# Patient Record
Sex: Male | Born: 1980 | Race: White | Hispanic: No | Marital: Married | Smoking: Never smoker
Health system: Southern US, Community
[De-identification: ages and names within clinical notes are randomized; demographics above are authoritative.]

## PROBLEM LIST (undated history)

## (undated) DIAGNOSIS — R809 Proteinuria, unspecified: Secondary | ICD-10-CM

## (undated) DIAGNOSIS — J45990 Exercise induced bronchospasm: Secondary | ICD-10-CM

## (undated) HISTORY — PX: REFRACTIVE SURGERY: SHX103

## (undated) HISTORY — DX: Proteinuria, unspecified: R80.9

## (undated) HISTORY — DX: Exercise induced bronchospasm: J45.990

## (undated) HISTORY — PX: WISDOM TOOTH EXTRACTION: SHX21

## (undated) HISTORY — PX: TONSILLECTOMY AND ADENOIDECTOMY: SUR1326

---

## 1999-08-14 ENCOUNTER — Encounter: Payer: Self-pay | Admitting: Internal Medicine

## 1999-08-14 ENCOUNTER — Ambulatory Visit (HOSPITAL_COMMUNITY): Admission: RE | Admit: 1999-08-14 | Discharge: 1999-08-14 | Payer: Self-pay | Admitting: Internal Medicine

## 2004-06-28 ENCOUNTER — Ambulatory Visit: Payer: Self-pay | Admitting: Internal Medicine

## 2004-10-31 ENCOUNTER — Ambulatory Visit: Payer: Self-pay | Admitting: Internal Medicine

## 2005-01-13 ENCOUNTER — Ambulatory Visit: Payer: Self-pay | Admitting: Internal Medicine

## 2005-01-22 ENCOUNTER — Ambulatory Visit: Payer: Self-pay | Admitting: Internal Medicine

## 2005-02-07 ENCOUNTER — Encounter: Admission: RE | Admit: 2005-02-07 | Discharge: 2005-02-07 | Payer: Self-pay | Admitting: Internal Medicine

## 2007-05-04 ENCOUNTER — Ambulatory Visit: Payer: Self-pay | Admitting: Internal Medicine

## 2007-05-06 LAB — CONVERTED CEMR LAB
Mumps IgG: 1.3 — ABNORMAL HIGH
Rubella: 48.5 intl units/mL — ABNORMAL HIGH
Rubeola IgG: 1.71 — ABNORMAL HIGH
Varicella IgG: 2.65 — ABNORMAL HIGH

## 2008-11-15 ENCOUNTER — Ambulatory Visit: Payer: Self-pay | Admitting: Internal Medicine

## 2008-11-15 DIAGNOSIS — J45909 Unspecified asthma, uncomplicated: Secondary | ICD-10-CM | POA: Insufficient documentation

## 2009-10-03 ENCOUNTER — Ambulatory Visit: Payer: Self-pay | Admitting: Internal Medicine

## 2009-10-03 DIAGNOSIS — J019 Acute sinusitis, unspecified: Secondary | ICD-10-CM

## 2009-10-03 DIAGNOSIS — J1189 Influenza due to unidentified influenza virus with other manifestations: Secondary | ICD-10-CM | POA: Insufficient documentation

## 2010-09-24 NOTE — Assessment & Plan Note (Signed)
Summary: ?SINUS INFECTION,FEVER 101.5/RH......Marland Kitchen   Vital Signs:  Patient profile:   30 year old male Weight:      153.8 pounds Temp:     100.1 degrees F oral Pulse rate:   124 / minute Resp:     15 per minute BP sitting:   120 / 80  (left arm) Cuff size:   regular  Vitals Entered By: Shonna Chock (October 03, 2009 1:35 PM) CC: Cough and a lot of congestion. Tested + for Flu on Sunday @ walk-in clinic Comments Taking Tamiflu for temp Symptoms   CC:  Cough and a lot of congestion. Tested + for Flu on Sunday @ walk-in clinic.  History of Present Illness: Onset 09/27/2009 as rigors , fever , myalgias, earache & frontal headache. Flu B + on on 02/06; Tamiflu Rxed. Now frontal headache, bilateral earache , dental pain, facial pain , yellow green from head & chest. PMH of EIB. Sinusitis in late 07/2009.  Allergies (verified): No Known Drug Allergies  Review of Systems General:  Complains of fever and sweats; denies chills. Eyes:  Denies discharge, eye pain, and red eye. ENT:  Complains of nasal congestion and sinus pressure; denies ear discharge. Resp:  Complains of sputum productive; OCCASIONAL WHEEZING; PMH of  EIB. GI:  Denies diarrhea. GU:  Denies discharge, dysuria, and hematuria.  Physical Exam  General:  Appears mildly uncomfortable-appearing.   Eyes:  No corneal or conjunctival inflammation noted. EOMI. Perrla. Ears:  Wax on R; L WNL Nose:  External nasal examination shows no deformity or inflammation. Nasal mucosa are  boggy without lesions or exudates. Mouth:  Oral mucosa and oropharynx without lesions or exudates.  Teeth in good repair. pharyngeal erythema.   Neck:  Supple Lungs:  Normal respiratory effort, chest expands symmetrically. Lungs are clear to auscultation, no crackles or wheezes. Deep cough Heart:   S4 tachycardia.   Extremities:  Neg SLR Cervical Nodes:  Shotty Axillary Nodes:  No palpable lymphadenopathy   Impression & Recommendations:  Problem #  1:  SINUSITIS- ACUTE-NOS (ICD-461.9)  His updated medication list for this problem includes:    Cefuroxime Axetil 500 Mg Tabs (Cefuroxime axetil) ..... 1 two times a day    Hydrocod Polst-chlorphen Polst 10-8 Mg/5ml Lqcr (Chlorpheniramine-hydrocodone) ..... 1 tsp q 8-12 hrs as needed  Problem # 2:  INFLUENZA (ICD-487.8)  Complete Medication List: 1)  Cefuroxime Axetil 500 Mg Tabs (Cefuroxime axetil) .... 1 two times a day 2)  Hydrocod Polst-chlorphen Polst 10-8 Mg/5ml Lqcr (Chlorpheniramine-hydrocodone) .... 1 tsp q 8-12 hrs as needed  Patient Instructions: 1)  Drink as much fluid as you can tolerate for the next few days. Neti pot once daily two times a day for head congestion. Advair every 12 hrs prn 2)  Recommended remaining out of work for  02/09 & 10/04/2009 Prescriptions: HYDROCOD POLST-CHLORPHEN POLST 10-8 MG/5ML LQCR (CHLORPHENIRAMINE-HYDROCODONE) 1 tsp q 8-12 hrs as needed  #90cc x 0   Entered and Authorized by:   Arielle Eber MD   Signed by:   Itzabella Sorrels MD on 10/03/2009   Method used:   Printed then faxed to ...       [id number] (retail)       [address]       [place], [place]  [address]       Ph: [phone] or [phone]       Fax: [phone]   RxID:   [id number] CEFUROXIME AXETIL 500 MG TABS (CEFUROXIME AXETIL) 1 two  times a day  #20 x 0   Entered and Authorized by:   Marga Melnick MD   Signed by:   Marga Melnick MD on 10/03/2009   Method used:   Faxed to ...       [id number] (retail)       [address]       [place], [place]  [address]       Ph: [phone] or [phone]       Fax: [phone]   RxID:   [id number]

## 2011-12-29 ENCOUNTER — Ambulatory Visit (HOSPITAL_BASED_OUTPATIENT_CLINIC_OR_DEPARTMENT_OTHER)
Admission: RE | Admit: 2011-12-29 | Discharge: 2011-12-29 | Disposition: A | Discharge status: Home / Self Care | Payer: BC Managed Care – PPO | Source: Ambulatory Visit | Attending: Internal Medicine | Admitting: Internal Medicine

## 2011-12-29 ENCOUNTER — Encounter: Payer: Self-pay | Admitting: Internal Medicine

## 2011-12-29 ENCOUNTER — Ambulatory Visit (INDEPENDENT_AMBULATORY_CARE_PROVIDER_SITE_OTHER): Payer: BC Managed Care – PPO | Admitting: Internal Medicine

## 2011-12-29 VITALS — BP 126/88 | HR 83 | Temp 98.3°F | Wt 157.0 lb

## 2011-12-29 DIAGNOSIS — M5412 Radiculopathy, cervical region: Secondary | ICD-10-CM

## 2011-12-29 DIAGNOSIS — M542 Cervicalgia: Secondary | ICD-10-CM

## 2011-12-29 DIAGNOSIS — M5414 Radiculopathy, thoracic region: Secondary | ICD-10-CM

## 2011-12-29 DIAGNOSIS — M549 Dorsalgia, unspecified: Secondary | ICD-10-CM

## 2011-12-29 DIAGNOSIS — M5030 Other cervical disc degeneration, unspecified cervical region: Secondary | ICD-10-CM | POA: Insufficient documentation

## 2011-12-29 DIAGNOSIS — M546 Pain in thoracic spine: Secondary | ICD-10-CM | POA: Insufficient documentation

## 2011-12-29 DIAGNOSIS — IMO0002 Reserved for concepts with insufficient information to code with codable children: Secondary | ICD-10-CM

## 2011-12-29 DIAGNOSIS — M4802 Spinal stenosis, cervical region: Secondary | ICD-10-CM

## 2011-12-29 MED ORDER — TRAMADOL HCL 50 MG PO TABS: 50.0000 mg | ORAL_TABLET | Freq: Four times a day (QID) | ORAL | Status: AC | PRN

## 2011-12-29 NOTE — Patient Instructions (Addendum)
Order for x-rays entered into  the computer; these will be performed at [facility]. No appointment is necessary.    Use a cervical memory foam pillow to prevent hyperextension or hyperflexion of the cervical spine. The best exercises for the low back include freestyle swimming, stretch aerobics, and yoga.  Please try to go on My Chart within the next 24 hours to allow me to release the results directly to you.

## 2011-12-29 NOTE — Progress Notes (Signed)
  Subjective:    Patient ID: Ethan Washington, male    DOB: 07/11/1981, 31 y.o.   MRN: [id number]  HPI In July 2011 he awoke with a "crick" in his neck. This was associated with discomfort and decreased range of motion. This spontaneously resolved over 2 weeks but  recurred shortly thereafter. Since that time has been constant; it is described as dull/vomiting. It will radiate into the left trapezius and triceps area. Nonsteroidals have been of minimal benefit. He comes in at this time as he states it is affecting his mood.  Past medical history/family history/social history were all reviewed and updated. Pertinent data: all non conntributory      Review of Systems History of repetitive motion:  no  History of overuse or hyperextension: no  History of trauma:  no   Symptoms Back Pain: only in trapezius Numbness/tingling: no Weakness:  no Fever:  no  Headache: L occipital area Bowel/bladder dysfunction: no       Objective:   Physical Exam Gen.: Thin but healthy and well-nourished in appearance. Alert, appropriate and cooperative throughout exam. Head: Normocephalic without obvious abnormalities  Eyes: No corneal or conjunctival inflammation noted. . Neck: No deformities, masses, or tenderness noted. Range of motion & Thyroid normal. Neck is supple Lungs: Normal respiratory effort; chest expands symmetrically. Lungs are clear to auscultation without rales, wheezes, or increased work of breathing. Heart: Normal rate and rhythm. Normal S1 and S2. No gallop, click, or rub. S4 w/o  murmur.                                                            Musculoskeletal/extremities: There is reverse curvature of the spine at the upper thoracic area with loss of expected lordotic curve and a lordotic curve in the lumbosacral area.. No clubbing, cyanosis, edema, or deformity noted. Range of motion  normal .Tone & strength  normal.Joints normal. Nail health  good. He is able to lie flat and sit up  without help. Straight leg raising is negative Vascular: Carotid artery pulses are full and equal. No bruits present. Neurologic: Alert and oriented x3. Deep tendon reflexes symmetrical and normal. Gait including tiptoe and heel walking is normal         Skin: Intact without suspicious lesions or rashes. Lymph: No cervical, axillary lymphadenopathy present. Psych: Mood and affect are normal. Normally interactive                                                                                         Assessment & Plan:  #1 neck discomfort with some radicular symptoms in the left trapezius and left triceps area. C7-T2 involvement suggested. This is in the context of the spinal curvature changes noted above.  Plan: See orders and recommendations. Baseline films of the cervical spine and thoracic spine indicated. If there is no congenital issue; physical therapy or chiropractor would be recommended.

## 2012-10-09 ENCOUNTER — Other Ambulatory Visit: Payer: Self-pay

## 2013-01-20 ENCOUNTER — Ambulatory Visit (INDEPENDENT_AMBULATORY_CARE_PROVIDER_SITE_OTHER): Payer: BC Managed Care – PPO | Admitting: Internal Medicine

## 2013-01-20 ENCOUNTER — Encounter: Payer: Self-pay | Admitting: Internal Medicine

## 2013-01-20 VITALS — BP 116/70 | HR 85 | Wt 161.0 lb

## 2013-01-20 DIAGNOSIS — R809 Proteinuria, unspecified: Secondary | ICD-10-CM

## 2013-01-20 LAB — BASIC METABOLIC PANEL
CO2: 29 mEq/L (ref 19–32)
Calcium: 9.2 mg/dL (ref 8.4–10.5)
Creatinine, Ser: 1 mg/dL (ref 0.4–1.5)
GFR: 92.67 mL/min (ref >60.00)
Sodium: 137 mEq/L (ref 135–145)

## 2013-01-20 LAB — POCT URINALYSIS DIPSTICK
Ketones, UA: NEGATIVE
Leukocytes, UA: NEGATIVE
Nitrite, UA: NEGATIVE
Protein, UA: NEGATIVE
Urobilinogen, UA: 0.2
pH, UA: 7.5

## 2013-01-20 NOTE — Progress Notes (Signed)
  Subjective:    Patient ID: Ethan Washington, male    DOB: 02-09-1981, 32 y.o.   MRN: [id number]  HPI  As part of an insurance exam; a urinalysis was performed which revealed some protein in the urine. He is to be related based on presumed "kidney disease". There is no past medical history or family history of renal disease.  He is very physically active performing calisthenics and running 3 miles every other day. The urine collected was in the context of this high level of exercise and some subjective dehydration.   Review of Systems   He denies dysuria, pyuria, hematuria, polyuria, polydipsia, or polyphagia.     Objective:   Physical Exam  Appears healthy and well-nourished & in no acute distress  No carotid bruits are present.No neck pain distention present at 10 - 15 degrees. Thyroid normal to palpation  Heart rhythm and rate are normal with no significant murmurs or gallops.Accentuated S1  Chest is clear with no increased work of breathing  There is no evidence of aortic aneurysm or renal artery bruits  Abdomen soft with no organomegaly or masses. No HJR  No clubbing, cyanosis or edema present.  Pedal pulses are intact   No ischemic skin changes are present . Nails healthy    Alert and oriented. Strength, tone, DTRs reflexes normal          Assessment & Plan:  #1 proteinuria on dip urine in the context of high level of exercise and possible component of dehydration. No past medical history or family history of renal disease.  Plan: Renal function; 24 urine for protein

## 2013-01-20 NOTE — Patient Instructions (Addendum)
Please collect urine for 24 hours for protein. Avoid heavy physical exercise for 48 hours prior to collection and stay well hydrated.

## 2013-01-27 LAB — PROTEIN, URINE, 24 HOUR: Protein, 24H Urine: 651 mg/d — ABNORMAL HIGH (ref 50–100)

## 2013-01-28 ENCOUNTER — Telehealth: Payer: Self-pay | Admitting: Internal Medicine

## 2013-01-28 DIAGNOSIS — R809 Proteinuria, unspecified: Secondary | ICD-10-CM

## 2013-01-28 NOTE — Telephone Encounter (Signed)
Left message to call office to advise Pt Ethan Washington just reviewed result on yesterday evening after hours and his assistance has not  Had a chance to place orders for referral yet. However I have place referral and our referral coordinator will be back in contact with him about appt info.

## 2013-01-28 NOTE — Telephone Encounter (Signed)
I made patient aware a Nephrology referral has been entered, and that he will be contacted by [facility] to schedule, he understands.

## 2013-01-28 NOTE — Telephone Encounter (Signed)
Patient states that he was supposed to receive a referral to see a Nephrologist. Referral not in system. Please call to advise.

## 2013-06-28 ENCOUNTER — Ambulatory Visit (INDEPENDENT_AMBULATORY_CARE_PROVIDER_SITE_OTHER): Payer: BC Managed Care – PPO | Admitting: Internal Medicine

## 2013-06-28 ENCOUNTER — Encounter: Payer: Self-pay | Admitting: Internal Medicine

## 2013-06-28 VITALS — BP 127/79 | HR 84 | Temp 98.5°F

## 2013-06-28 DIAGNOSIS — J209 Acute bronchitis, unspecified: Secondary | ICD-10-CM

## 2013-06-28 DIAGNOSIS — J0100 Acute maxillary sinusitis, unspecified: Secondary | ICD-10-CM

## 2013-06-28 MED ORDER — CEFUROXIME AXETIL 500 MG PO TABS: 500.0000 mg | ORAL_TABLET | Freq: Two times a day (BID) | ORAL | Status: DC

## 2013-06-28 MED ORDER — BUDESONIDE-FORMOTEROL FUMARATE 160-4.5 MCG/ACT IN AERO: 2.0000 | INHALATION_SPRAY | Freq: Two times a day (BID) | RESPIRATORY_TRACT | Status: DC

## 2013-06-28 MED ORDER — HYDROCODONE-HOMATROPINE 5-1.5 MG/5ML PO SYRP: 5.0000 mL | ORAL_SOLUTION | Freq: Four times a day (QID) | ORAL | Status: DC | PRN

## 2013-06-28 NOTE — Progress Notes (Signed)
  Subjective:    Patient ID: Ethan Washington, male    DOB: 1980-10-24, 32 y.o.   MRN: [id number]  HPI  Symptoms began in September of this year as a dry cough which is  stable and persistent. He does produce scant green sputum intermittently and has had some green nasal discharge. He describes facial sinus area pain. He has shortness of breath with the cough.  As of this morning at 3 AM he said pressure in the right ear associated with decreased hearing and tinnitus. There has been no discharge the ear. He's also had pain in his upper teeth.  He's been using albuterol with less effect   Review of Systems  He denies extrinsic symptoms of itchy, watery eyes or sneezing.  He has no frontal sinus pain.  There's been no wheezing with cough.  He denies fever, chills, or sweats.      Objective:   Physical Exam General appearance:good health ;well nourished; no acute distress or increased work of breathing is present.  No  lymphadenopathy about the head, neck, or axilla noted.   Eyes: No conjunctival inflammation or lid edema is present.   Ears:  External ear exam shows no significant lesions or deformities.  Hemorrhagic changes left TM. Increase from the right canal with minimal visualization of the right TM  Nose:  External nasal examination shows no deformity or inflammation. Nasal mucosa are dry and erythematous without lesions or exudates. No septal dislocation or deviation.No obstruction to airflow.   Oral exam: Dental hygiene is good; lips and gums are healthy appearing.There is no oropharyngeal erythema or exudate noted.   Neck:  No deformities,  masses, or tenderness noted.      Heart:  Normal rate and regular rhythm. S1 and S2 normal without gallop, murmur, click, rub or other extra sounds.   Lungs:Chest clear to auscultation; no wheezes, rhonchi,rales ,or rubs present.No increased work of breathing.   Harsh brassy cough  Extremities:  No cyanosis, edema, or clubbing  noted     Skin: Warm & dry          Assessment & Plan:  #1 protracted bronchitis  #2 maxillary sinusitis  #3 ear pain decreased hearing &  tinnitus ; most likely related to the cerumen  Plan :see orders

## 2013-06-28 NOTE — Patient Instructions (Signed)
Please do not use Q-tips as we discussed. Should wax build up occur, please put 2-3 drops of mineral oil in the affected  ear at night to soften the wax .Cover the canal with a  cotton ball to prevent the oil from staining bed linens. In the morning fill the ear canal with hydrogen peroxide & lie in the opposite lateral decubitus position(on the side opposite the affected ear)  for 10-15 minutes. After allowing this period of time for the peroxide to dissolve the wax ;shower and use the thinnest washrag available to wick out the wax. If both ears are involved ; alternate this treatment from ear to ear each night until no wax is found on the washrag.Plain Mucinex (NOT D) for thick secretions ;force NON dairy fluids .   Nasal cleansing in the shower as discussed with lather of mild shampoo.After 10 seconds wash off lather while  exhaling through nostrils. Make sure that all residual soap is removed to prevent irritation.  Nasacort AQ OTC 1 spray in each nostril twice a day as needed. Use the "crossover" technique into opposite nostril spraying toward opposite ear @ 45 degree angle, not straight up into nostril.  Use a Neti pot daily only  as needed for significant sinus congestion; going from open side to congested side . Plain Allegra (NOT D )  160 daily , Loratidine 10 mg , OR Zyrtec 10 mg @ bedtime  as needed for itchy eyes & sneezing.

## 2013-06-30 ENCOUNTER — Other Ambulatory Visit: Payer: Self-pay

## 2013-09-08 ENCOUNTER — Telehealth: Payer: Self-pay | Admitting: *Deleted

## 2013-09-08 ENCOUNTER — Other Ambulatory Visit: Payer: Self-pay | Admitting: *Deleted

## 2013-09-08 MED ORDER — OSELTAMIVIR PHOSPHATE 75 MG PO CAPS: 75.0000 mg | ORAL_CAPSULE | Freq: Every day | ORAL | Status: DC

## 2013-09-08 NOTE — Telephone Encounter (Signed)
Script sent. JG//CMA 

## 2013-09-08 NOTE — Telephone Encounter (Signed)
Patient called and stated his wife was tested positive for flu yesterday. He would like to know if he should begin Tamiflu. Please advise. JG//CMA  *send script to [facility]

## 2013-09-08 NOTE — Telephone Encounter (Signed)
Tamiflu 75 mg qd #10.

## 2014-06-19 ENCOUNTER — Other Ambulatory Visit: Payer: Self-pay | Admitting: Nephrology

## 2014-06-19 ENCOUNTER — Ambulatory Visit
Admission: RE | Admit: 2014-06-19 | Discharge: 2014-06-19 | Disposition: A | Discharge status: Home / Self Care | Payer: PRIVATE HEALTH INSURANCE | Source: Ambulatory Visit | Attending: Nephrology | Admitting: Nephrology

## 2014-06-19 DIAGNOSIS — R059 Cough, unspecified: Secondary | ICD-10-CM

## 2014-06-19 DIAGNOSIS — R05 Cough: Secondary | ICD-10-CM

## 2014-07-09 ENCOUNTER — Encounter: Payer: Self-pay | Admitting: Internal Medicine

## 2014-07-09 DIAGNOSIS — R809 Proteinuria, unspecified: Secondary | ICD-10-CM | POA: Insufficient documentation

## 2015-04-09 ENCOUNTER — Ambulatory Visit (HOSPITAL_BASED_OUTPATIENT_CLINIC_OR_DEPARTMENT_OTHER)
Admission: RE | Admit: 2015-04-09 | Discharge: 2015-04-09 | Disposition: A | Discharge status: Home / Self Care | Payer: PRIVATE HEALTH INSURANCE | Source: Ambulatory Visit | Attending: Medical | Admitting: Medical

## 2015-04-09 ENCOUNTER — Ambulatory Visit (INDEPENDENT_AMBULATORY_CARE_PROVIDER_SITE_OTHER): Payer: PRIVATE HEALTH INSURANCE | Admitting: Medical

## 2015-04-09 ENCOUNTER — Encounter: Payer: Self-pay | Admitting: Medical

## 2015-04-09 ENCOUNTER — Encounter: Payer: PRIVATE HEALTH INSURANCE | Admitting: Medical

## 2015-04-09 VITALS — BP 130/80 | HR 77 | Temp 98.7°F | Ht 69.75 in | Wt 162.6 lb

## 2015-04-09 DIAGNOSIS — R55 Syncope and collapse: Secondary | ICD-10-CM | POA: Diagnosis not present

## 2015-04-09 DIAGNOSIS — R42 Dizziness and giddiness: Secondary | ICD-10-CM

## 2015-04-09 DIAGNOSIS — R0602 Shortness of breath: Secondary | ICD-10-CM | POA: Insufficient documentation

## 2015-04-09 DIAGNOSIS — R0600 Dyspnea, unspecified: Secondary | ICD-10-CM

## 2015-04-09 DIAGNOSIS — R5383 Other fatigue: Secondary | ICD-10-CM | POA: Diagnosis not present

## 2015-04-09 DIAGNOSIS — I517 Cardiomegaly: Secondary | ICD-10-CM

## 2015-04-09 LAB — POCT URINALYSIS DIPSTICK
BILIRUBIN UA: NEGATIVE
Glucose, UA: NEGATIVE
KETONES UA: NEGATIVE
Leukocytes, UA: NEGATIVE
Nitrite, UA: NEGATIVE
PH UA: 7
Protein, UA: NEGATIVE
RBC UA: NEGATIVE
Spec Grav, UA: 1.015
Urobilinogen, UA: 2

## 2015-04-09 LAB — COMPREHENSIVE METABOLIC PANEL
ALBUMIN: 4.3 g/dL (ref 3.5–5.2)
ALK PHOS: 69 U/L (ref 39–117)
ALT: 23 U/L (ref 0–53)
AST: 23 U/L (ref 0–37)
BILIRUBIN TOTAL: 0.6 mg/dL (ref 0.2–1.2)
BUN: 15 mg/dL (ref 6–23)
CO2: 33 mEq/L — ABNORMAL HIGH (ref 19–32)
CREATININE: 0.98 mg/dL (ref 0.40–1.50)
Calcium: 9.4 mg/dL (ref 8.4–10.5)
Chloride: 100 mEq/L (ref 96–112)
GFR: 92.52 mL/min (ref >60.00)
Glucose, Bld: 94 mg/dL (ref 70–99)
Potassium: 4 mEq/L (ref 3.5–5.1)
SODIUM: 138 meq/L (ref 135–145)
TOTAL PROTEIN: 6.8 g/dL (ref 6.0–8.3)

## 2015-04-09 LAB — CBC WITH DIFFERENTIAL/PLATELET
Basophils Absolute: 0 10*3/uL (ref 0.0–0.1)
Basophils Relative: 0.4 % (ref 0.0–3.0)
EOS ABS: 0 10*3/uL (ref 0.0–0.7)
EOS PCT: 0.9 % (ref 0.0–5.0)
HCT: 46.8 % (ref 39.0–52.0)
HEMOGLOBIN: 15.8 g/dL (ref 13.0–17.0)
Lymphocytes Relative: 26.1 % (ref 12.0–46.0)
Lymphs Abs: 1.3 10*3/uL (ref 0.7–4.0)
MCHC: 33.8 g/dL (ref 30.0–36.0)
MCV: 90.8 fl (ref 78.0–100.0)
MONO ABS: 0.4 10*3/uL (ref 0.1–1.0)
Monocytes Relative: 8.3 % (ref 3.0–12.0)
Neutro Abs: 3.3 10*3/uL (ref 1.4–7.7)
Neutrophils Relative %: 64.3 % (ref 43.0–77.0)
Platelets: 238 10*3/uL (ref 150.0–400.0)
RBC: 5.16 Mil/uL (ref 4.22–5.81)
RDW: 12.7 % (ref 11.5–15.5)
WBC: 5.1 10*3/uL (ref 4.0–10.5)

## 2015-04-09 LAB — TSH: TSH: 1.64 u[IU]/mL (ref 0.35–4.50)

## 2015-04-09 MED ORDER — ALBUTEROL SULFATE HFA 108 (90 BASE) MCG/ACT IN AERS: 2.0000 | INHALATION_SPRAY | Freq: Four times a day (QID) | RESPIRATORY_TRACT | Status: AC | PRN

## 2015-04-09 NOTE — Progress Notes (Signed)
Pre visit review using our clinic review tool, if applicable. No additional management support is needed unless otherwise documented below in the visit note. 

## 2015-04-09 NOTE — Progress Notes (Signed)
Subjective:    Patient ID: Ethan Washington, male    DOB: Nov 08, 1980, 34 y.o.   MRN: [id number]  HPI   I have reviewed pt PMH, PSH, FH, Social History and Surgical History.  Pt in for recent near syncope. Pt states a week ago he dizzy and light headed. He felt like he would almost pass out. But did not. 2nd time he was eating. Pt states rare occasional light headed sensation in past before these 2 most recent events. States occasional if long time without eating will feel shaky. But these 2 described possible near syncope may were not after not eating for long period of time. (the second time came on with acute sweating and slight shaky when felt very dizzy)  Pt states he has been feeling tired since this last Thursday. Pt states feels very short of breath going up flights of steps. Also cutting grass felt very tired.   Pt states some bright red in his stools. No itching to his rectum. Hx of some hemorrhoids. No rectal pain. No dark black stools.  Pt has hx of occasional inhaler use following upper respiratory infection. 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        Review of Systems  Constitutional: Positive for fatigue. Negative for fever and chills.  Respiratory: Positive for shortness of breath. Negative for cough, choking and chest tightness.   Cardiovascular: Negative for chest pain and palpitations.  Neurological: Positive for dizziness. Negative for seizures, syncope, weakness, light-headedness and numbness.       No syncope but describes almost near syncope.  Hematological: Negative for adenopathy. Does not bruise/bleed easily.  Psychiatric/Behavioral: Negative for behavioral problems and confusion.    Past Medical History  Diagnosis Date  . Exercise induced bronchospasm     PMH of    Social History   Social History  . Marital Status: Married    Spouse Name: N/A  . Number of Children: N/A  . Years of Education: N/A     Occupational History  . Not on file.   Social History Main Topics  . Smoking status: Never Smoker   . Smokeless tobacco: Not on file  . Alcohol Use: Yes     Comment:  2 wine / week or <  . Drug Use: No  . Sexual Activity: Not on file   Other Topics Concern  . Not on file   Social History Narrative    Past Surgical History  Procedure Laterality Date  . Tonsillectomy and adenoidectomy    . Wisdom tooth extraction    . Refractive surgery Bilateral     Family History  Problem Relation Age of Onset  . Stroke Mother 2  . Heart disease Mother     myocarditis  . Cancer Maternal Grandfather     cns  . Diabetes Maternal Grandfather   . Lung cancer Paternal Grandmother     smoker  . Diabetes Paternal Grandfather   . Hypertension Neg Hx   . Kidney disease Neg Hx     No Known Allergies  Current Outpatient Prescriptions on File Prior to Visit  Medication Sig Dispense Refill  . budesonide-formoterol (SYMBICORT) 160-4.5 MCG/ACT inhaler Inhale 2 puffs into the lungs 2 (two) times daily. (Patient not taking: Reported on 04/09/2015) 1 Inhaler 3  . cefUROXime (CEFTIN) 500 MG tablet Take 1 tablet (500 mg total) by mouth 2 (two) times daily. (Patient not taking: Reported on 04/09/2015) 20  tablet 0  . HYDROcodone-homatropine (HYDROMET) 5-1.5 MG/5ML syrup Take 5 mLs by mouth every 6 (six) hours as needed for cough. (Patient not taking: Reported on 04/09/2015) 120 mL 0  . oseltamivir (TAMIFLU) 75 MG capsule Take 1 capsule (75 mg total) by mouth daily. (Patient not taking: Reported on 04/09/2015) 10 capsule 0   No current facility-administered medications on file prior to visit.    BP 130/80 mmHg  Pulse 77  Temp(Src) 98.7 F (37.1 C) (Oral)  Ht 5' 9.75" (1.772 m)  Wt 162 lb 9.6 oz (73.755 kg)  BMI 23.49 kg/m2  SpO2 100%       Objective:   Physical Exam  General Mental Status- Alert. General Appearance- Not in acute distress.   Skin General: Color- Normal Color. Moisture-  Normal Moisture.  Neck Carotid Arteries- Normal color. Moisture- Normal Moisture. No carotid bruits. No JVD.  Chest and Lung Exam Auscultation: Breath Sounds:-Normal.  Cardiovascular Auscultation:Rythm- Regular, rate and rhythm. Murmurs & Other Heart Sounds:Auscultation of the heart reveals- No Murmurs.  Abdomen Inspection:-Inspeection Normal. Palpation/Percussion:Note:No mass. Palpation and Percussion of the abdomen reveal- Non Tender, Non Distended + BS, no rebound or guarding.    Neurologic Cranial Nerve exam:- CN III-XII intact(No nystagmus), symmetric smile. Drift Test:- No drift. Romberg Exam:- Negative.  Heal to Toe Gait exam:-Normal. Finger to Nose:- Normal/Intact Strength:- 5/5 equal and symmetric strength both upper and lower extremities.  Lower ext- no pedal edema. Neg homans signs.  Rectum- on inspection. No hemorrhoids seen. No fissures.      Assessment & Plan:  No ekg to compare. Appears to show lvh and rt bundle branch block.(in light of profound described near syncope will refer to cardiologist)  For you dizziness, fatigue and sob.  Will get cbc, cmp, tsh, cxr and did ekg.  Will try to find old ekg to compare. But with symptoms may refer to cardiology. Presently no exercise. Take elevator at work.   Follow up in 10 days or as needed  Follow up with nephrologist for history of proteinuria.  If dizzy episodes occur at work. Ask staff to check bp, pulse 02 and fsbs.

## 2015-04-09 NOTE — Patient Instructions (Addendum)
For you dizziness, fatigue and sob.  Will get cbc, cmp, tsh, cxr and did ekg.  Will try to find old ekg to compare. But with symptoms may refer to cardiology. Presently no exercise. Take elevator at work.   Follow up in 10 days or as needed  Follow up with nephrologist for history of proteinuria.  If dizzy episodes occur at work. Ask staff to check bp, pulse 02 and fsbs.

## 2015-04-12 ENCOUNTER — Ambulatory Visit (INDEPENDENT_AMBULATORY_CARE_PROVIDER_SITE_OTHER): Payer: PRIVATE HEALTH INSURANCE | Admitting: Cardiology

## 2015-04-12 ENCOUNTER — Telehealth: Payer: Self-pay | Admitting: Cardiology

## 2015-04-12 ENCOUNTER — Encounter: Payer: Self-pay | Admitting: Cardiology

## 2015-04-12 ENCOUNTER — Encounter: Payer: Self-pay | Admitting: *Deleted

## 2015-04-12 VITALS — BP 132/82 | HR 94 | Ht 70.0 in | Wt 163.5 lb

## 2015-04-12 DIAGNOSIS — R0600 Dyspnea, unspecified: Secondary | ICD-10-CM

## 2015-04-12 DIAGNOSIS — R55 Syncope and collapse: Secondary | ICD-10-CM | POA: Diagnosis not present

## 2015-04-12 NOTE — Assessment & Plan Note (Signed)
Etiology unclear. Some episodes sound orthostatic mediated but others less so. These episodes are long-standing as he had his first one in his late teenage years. I will schedule an echocardiogram to assess LV function. We will arrange an event monitor to exclude significant arrhythmia. I have asked him to increase his fluid and salt intake in case orthostasis is contributing.

## 2015-04-12 NOTE — Progress Notes (Signed)
     HPI: 34 year old male for evaluation of near syncope. Patient seen recently by primary care for near syncope. Chest x-ray negative. Hemoglobin, TSH and potassium normal. Patient has had intermittent dizzy spells for approximately 15 years. Some occur with sudden standing but others have occurred while seated. He had 2 episodes recently and we were asked to evaluate. His most recent episode occurred at work. He was sitting down to eat and developed sudden dizziness. No associated dyspnea, nausea, chest pain or palpitations. His symptoms lasted approximately 15-20 seconds and resolved. He has never had frank syncope. Otherwise denies dyspnea on exertion, orthopnea, PND, pedal edema or exertional chest pain. Some dizziness with standing at times.  Current Outpatient Prescriptions  Medication Sig Dispense Refill  . albuterol (PROVENTIL HFA;VENTOLIN HFA) 108 (90 BASE) MCG/ACT inhaler Inhale 2 puffs into the lungs every 6 (six) hours as needed for wheezing or shortness of breath. 1 Inhaler 0  . Multiple Vitamins-Minerals (MULTIVITAMIN WITH MINERALS) tablet Take 1 tablet by mouth daily.     No current facility-administered medications for this visit.    No Known Allergies   Past Medical History  Diagnosis Date  . Exercise induced bronchospasm     PMH of  . Proteinuria     Past Surgical History  Procedure Laterality Date  . Tonsillectomy and adenoidectomy    . Wisdom tooth extraction    . Refractive surgery Bilateral     Social History   Social History  . Marital Status: Married    Spouse Name: N/A  . Number of Children: 2  . Years of Education: N/A   Occupational History  .      [employer]   Social History Main Topics  . Smoking status: Never Smoker   . Smokeless tobacco: Not on file  . Alcohol Use: Yes     Comment:  2 wine / week or <  . Drug Use: No  . Sexual Activity: Not on file   Other Topics Concern  . Not on file   Social History Narrative     Family History  Problem Relation Age of Onset  . Stroke Mother 55  . Heart disease Mother     myocarditis  . Cancer Maternal Grandfather     cns  . Diabetes Maternal Grandfather   . Lung cancer Paternal Grandmother     smoker  . Diabetes Paternal Grandfather   . Hypertension Neg Hx   . Kidney disease Neg Hx     ROS: no fevers or chills, productive cough, hemoptysis, dysphasia, odynophagia, melena, hematochezia, dysuria, hematuria, rash, seizure activity, orthopnea, PND, pedal edema, claudication. Remaining systems are negative.  Physical Exam:   Blood pressure 132/82, pulse 94, height  (1.778 m), weight 163 lb 8 oz (74.163 kg).  General:  Well developed/well nourished in NAD Skin warm/dry Patient not depressed No peripheral clubbing Back-normal HEENT-normal/normal eyelids Neck supple/normal carotid upstroke bilaterally; no bruits; no JVD; no thyromegaly chest - CTA/ normal expansion CV - RRR/normal S1 and S2; no murmurs, rubs or gallops;  PMI nondisplaced Abdomen -NT/ND, no HSM, no mass, + bowel sounds, no bruit 2+ femoral pulses, no bruits Ext-no edema, chords, 2+ DP Neuro-grossly nonfocal  ECG 04/09/2015-sinus rhythm, incomplete right bundle branch block.

## 2015-04-12 NOTE — Patient Instructions (Signed)
Your physician recommends that you schedule a follow-up appointment in: 8 WEEKS WITH DR Jens Som  Your physician has requested that you have an echocardiogram. Echocardiography is a painless test that uses sound waves to create images of your heart. It provides your doctor with information about the size and shape of your heart and how well your heart's chambers and valves are working. This procedure takes approximately one hour. There are no restrictions for this procedure.   Your physician has recommended that you wear an event monitor. Event monitors are medical devices that record the heart's electrical activity. Doctors most often [place] these monitors to diagnose arrhythmias. Arrhythmias are problems with the speed or rhythm of the heartbeat. The monitor is a small, portable device. You can wear one while you do your normal daily activities. This is usually used to diagnose what is causing palpitations/syncope (passing out).

## 2015-04-12 NOTE — Telephone Encounter (Signed)
Received records from [facility] for appointment on 04/12/15 with Dr Jens Som.  Records given to [facility] (medical records) for Dr Ludwig Clarks schedule on 04/12/15. lp

## 2015-04-20 ENCOUNTER — Other Ambulatory Visit (INDEPENDENT_AMBULATORY_CARE_PROVIDER_SITE_OTHER): Payer: PRIVATE HEALTH INSURANCE

## 2015-04-20 DIAGNOSIS — R0789 Other chest pain: Secondary | ICD-10-CM

## 2015-04-20 DIAGNOSIS — E785 Hyperlipidemia, unspecified: Secondary | ICD-10-CM

## 2015-04-20 LAB — LIPID PANEL
CHOLESTEROL: 235 mg/dL — AB (ref 0–200)
HDL: 60.5 mg/dL (ref >39.00)
LDL Cholesterol: 160 mg/dL — ABNORMAL HIGH (ref 0–99)
NonHDL: 174.88
Total CHOL/HDL Ratio: 4
Triglycerides: 76 mg/dL (ref 0.0–149.0)
VLDL: 15.2 mg/dL (ref 0.0–40.0)

## 2015-04-20 NOTE — Telephone Encounter (Signed)
Pt came in fasting. He was under impression get labs. I reviewed notes and did not see that I advised him to repeat or get new. However he has some sob episodes, dizziness and has seen cardiology. Full work up pending. I talked with him and he states occasionally with pain will get low level mild chest discomfort. So advised lab to get lipid panel. Put in diagnosis atypical chest pain.

## 2015-04-23 NOTE — Addendum Note (Signed)
Addended by: Neldon Labella on: 04/23/2015 08:59 AM   Modules accepted: Orders

## 2015-04-24 ENCOUNTER — Telehealth: Payer: Self-pay | Admitting: Cardiology

## 2015-04-24 NOTE — Telephone Encounter (Signed)
Order for echo and monitor mailed to patient

## 2015-04-24 NOTE — Telephone Encounter (Signed)
Pt called in and cancelled his Event monitor and his Echo because he was informed by his insurance that since he works for [employer] he could have these test done for free. He would like to know if he needs a referral in order to have the echo and monitor done. Please f/u with pt  Thanks

## 2015-04-27 ENCOUNTER — Other Ambulatory Visit (HOSPITAL_COMMUNITY): Payer: PRIVATE HEALTH INSURANCE

## 2015-06-08 ENCOUNTER — Ambulatory Visit: Payer: PRIVATE HEALTH INSURANCE | Admitting: Cardiology

## 2015-07-27 ENCOUNTER — Other Ambulatory Visit: Payer: PRIVATE HEALTH INSURANCE

## 2015-07-27 ENCOUNTER — Other Ambulatory Visit (INDEPENDENT_AMBULATORY_CARE_PROVIDER_SITE_OTHER): Payer: PRIVATE HEALTH INSURANCE

## 2015-07-27 ENCOUNTER — Telehealth: Payer: Self-pay | Admitting: Medical

## 2015-07-27 DIAGNOSIS — E785 Hyperlipidemia, unspecified: Secondary | ICD-10-CM

## 2015-07-27 LAB — LIPID PANEL
CHOL/HDL RATIO: 4
Cholesterol: 243 mg/dL — ABNORMAL HIGH (ref 0–200)
HDL: 56.1 mg/dL (ref >39.00)
LDL Cholesterol: 168 mg/dL — ABNORMAL HIGH (ref 0–99)
NONHDL: 187.21
Triglycerides: 94 mg/dL (ref 0.0–149.0)
VLDL: 18.8 mg/dL (ref 0.0–40.0)

## 2015-07-27 MED ORDER — ATORVASTATIN CALCIUM 10 MG PO TABS: 10.0000 mg | ORAL_TABLET | Freq: Every day | ORAL | Status: DC

## 2015-07-27 NOTE — Telephone Encounter (Signed)
crestor sent to his pharmacy. If he starts recheck cmp and lipid in 3 months fasting

## 2015-10-30 ENCOUNTER — Telehealth: Payer: Self-pay | Admitting: Medical

## 2015-10-30 DIAGNOSIS — Z3009 Encounter for other general counseling and advice on contraception: Secondary | ICD-10-CM

## 2015-10-30 NOTE — Telephone Encounter (Signed)
Referred pt to urologist.

## 2015-10-30 NOTE — Telephone Encounter (Signed)
Please advise on referral

## 2015-10-30 NOTE — Telephone Encounter (Signed)
Pt called in to request a referral to a [facility] if possible, he says that he is wanting a vasectomy . He says that he works for [employer]. ALSO, pt need appt to be on a Friday if possible.   CB: [phone]

## 2016-01-28 ENCOUNTER — Encounter: Payer: Self-pay | Admitting: Medical

## 2016-01-29 MED ORDER — ATORVASTATIN CALCIUM 10 MG PO TABS: 10.0000 mg | ORAL_TABLET | Freq: Every day | ORAL | Status: DC

## 2016-01-29 NOTE — Telephone Encounter (Signed)
Rx atorvastatin sent in.

## 2016-01-30 ENCOUNTER — Telehealth: Payer: Self-pay | Admitting: Medical

## 2016-01-30 NOTE — Telephone Encounter (Signed)
Relation to EA:VWUJpt:self Call back [phone] Pharmacy:  Reason for call:  Patient [facility] [place]

## 2016-01-31 ENCOUNTER — Telehealth: Payer: Self-pay

## 2016-01-31 NOTE — Telephone Encounter (Signed)
Spoke with Victorino DikeJennifer at [facility]. I had to verify the patients name, Date of birth, and his address. I was transferred to the other line with patient services from [place]. I was put on hold for 10 minutes and had to end the call due to pt. Care in the office. Per E.Saguier pt may have to change the medication that will be given to the patient. I advised Amado CoeJessica Glover that completes Prior Authorizations in the office.

## 2016-04-26 IMAGING — DX DG CHEST 2V
2 series · 2 of 2 positions shown · non-contrast
Comparison: 06/19/2014

CLINICAL DATA: Shortness of breath and fatigue

EXAM:
CHEST - 2 VIEW

[chest pa]
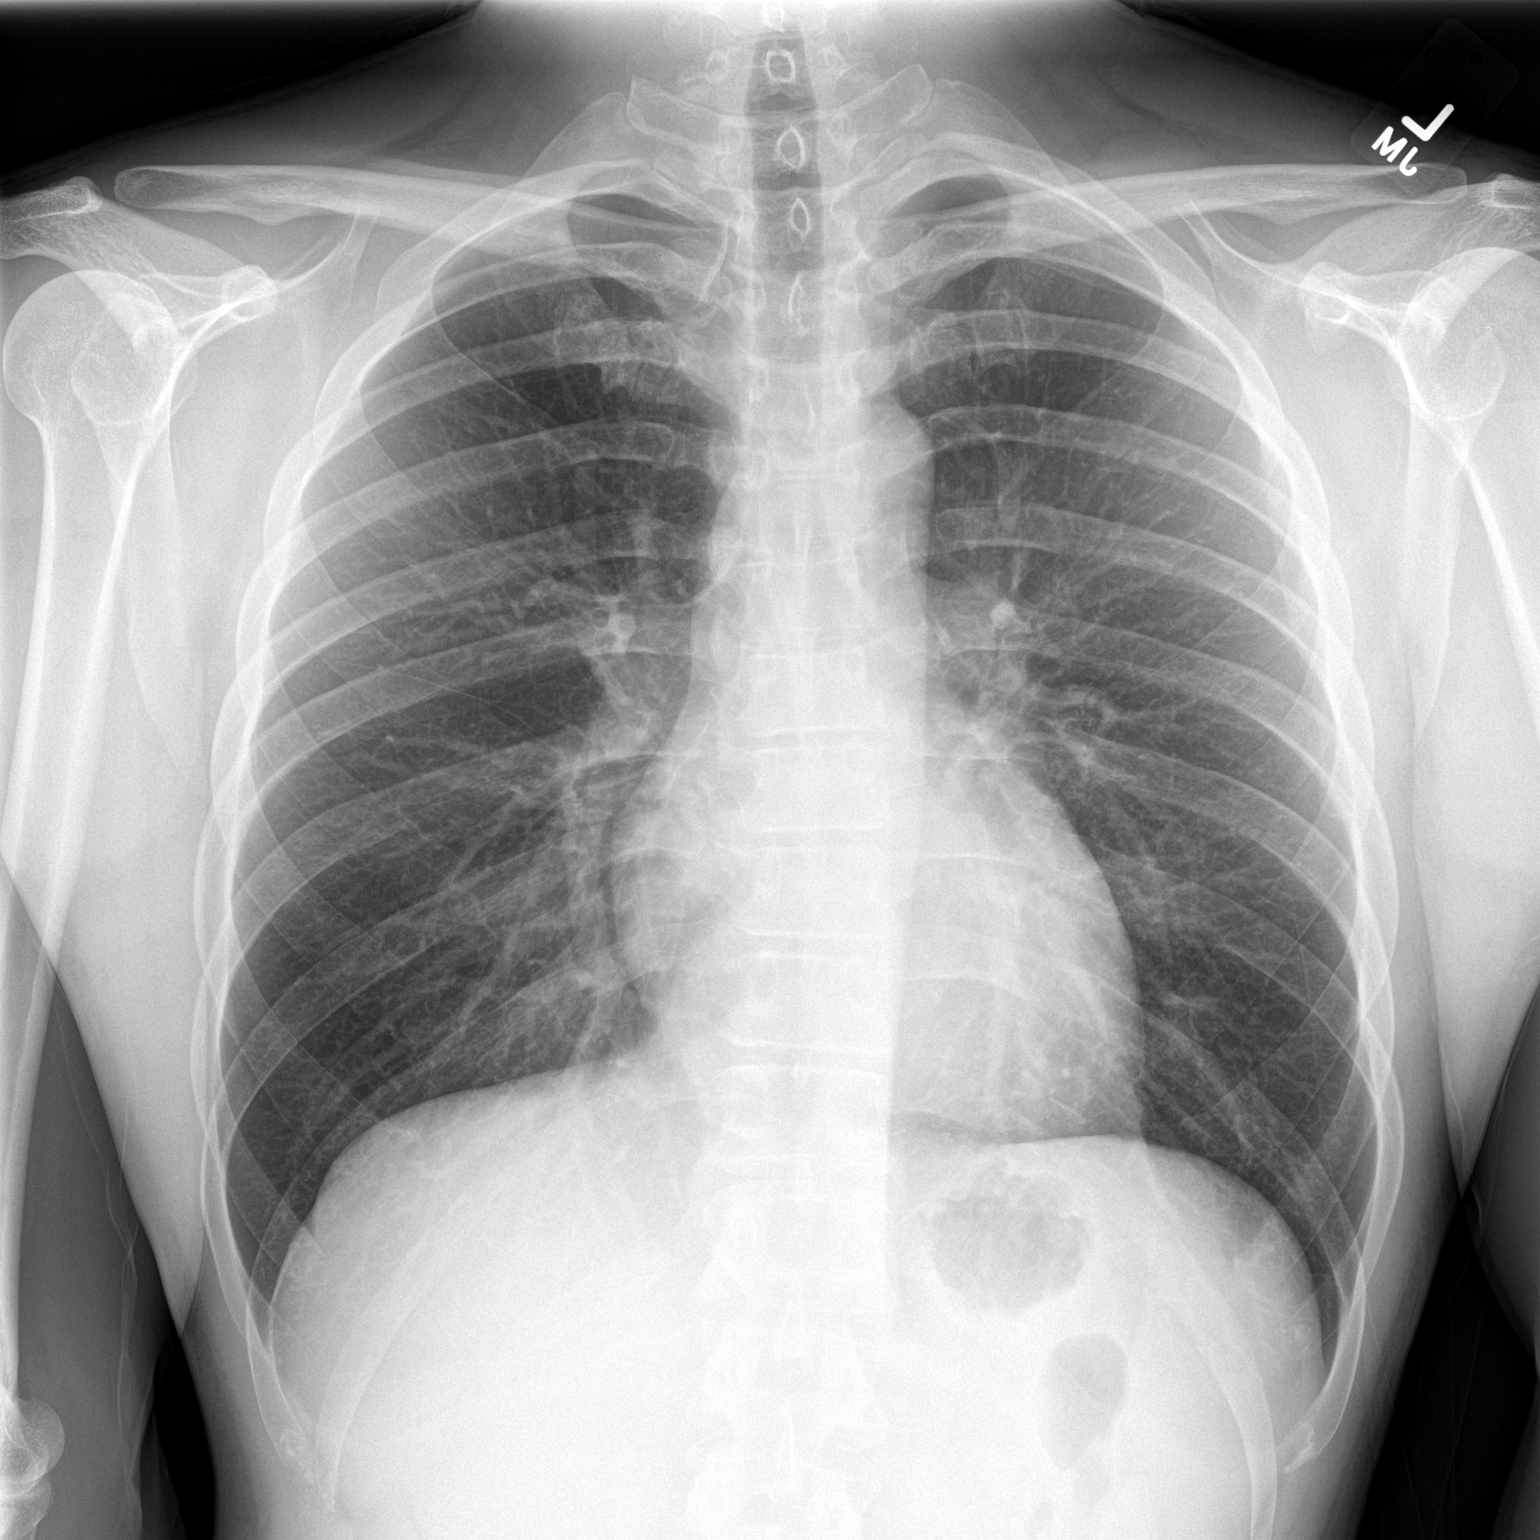

[chest lat]
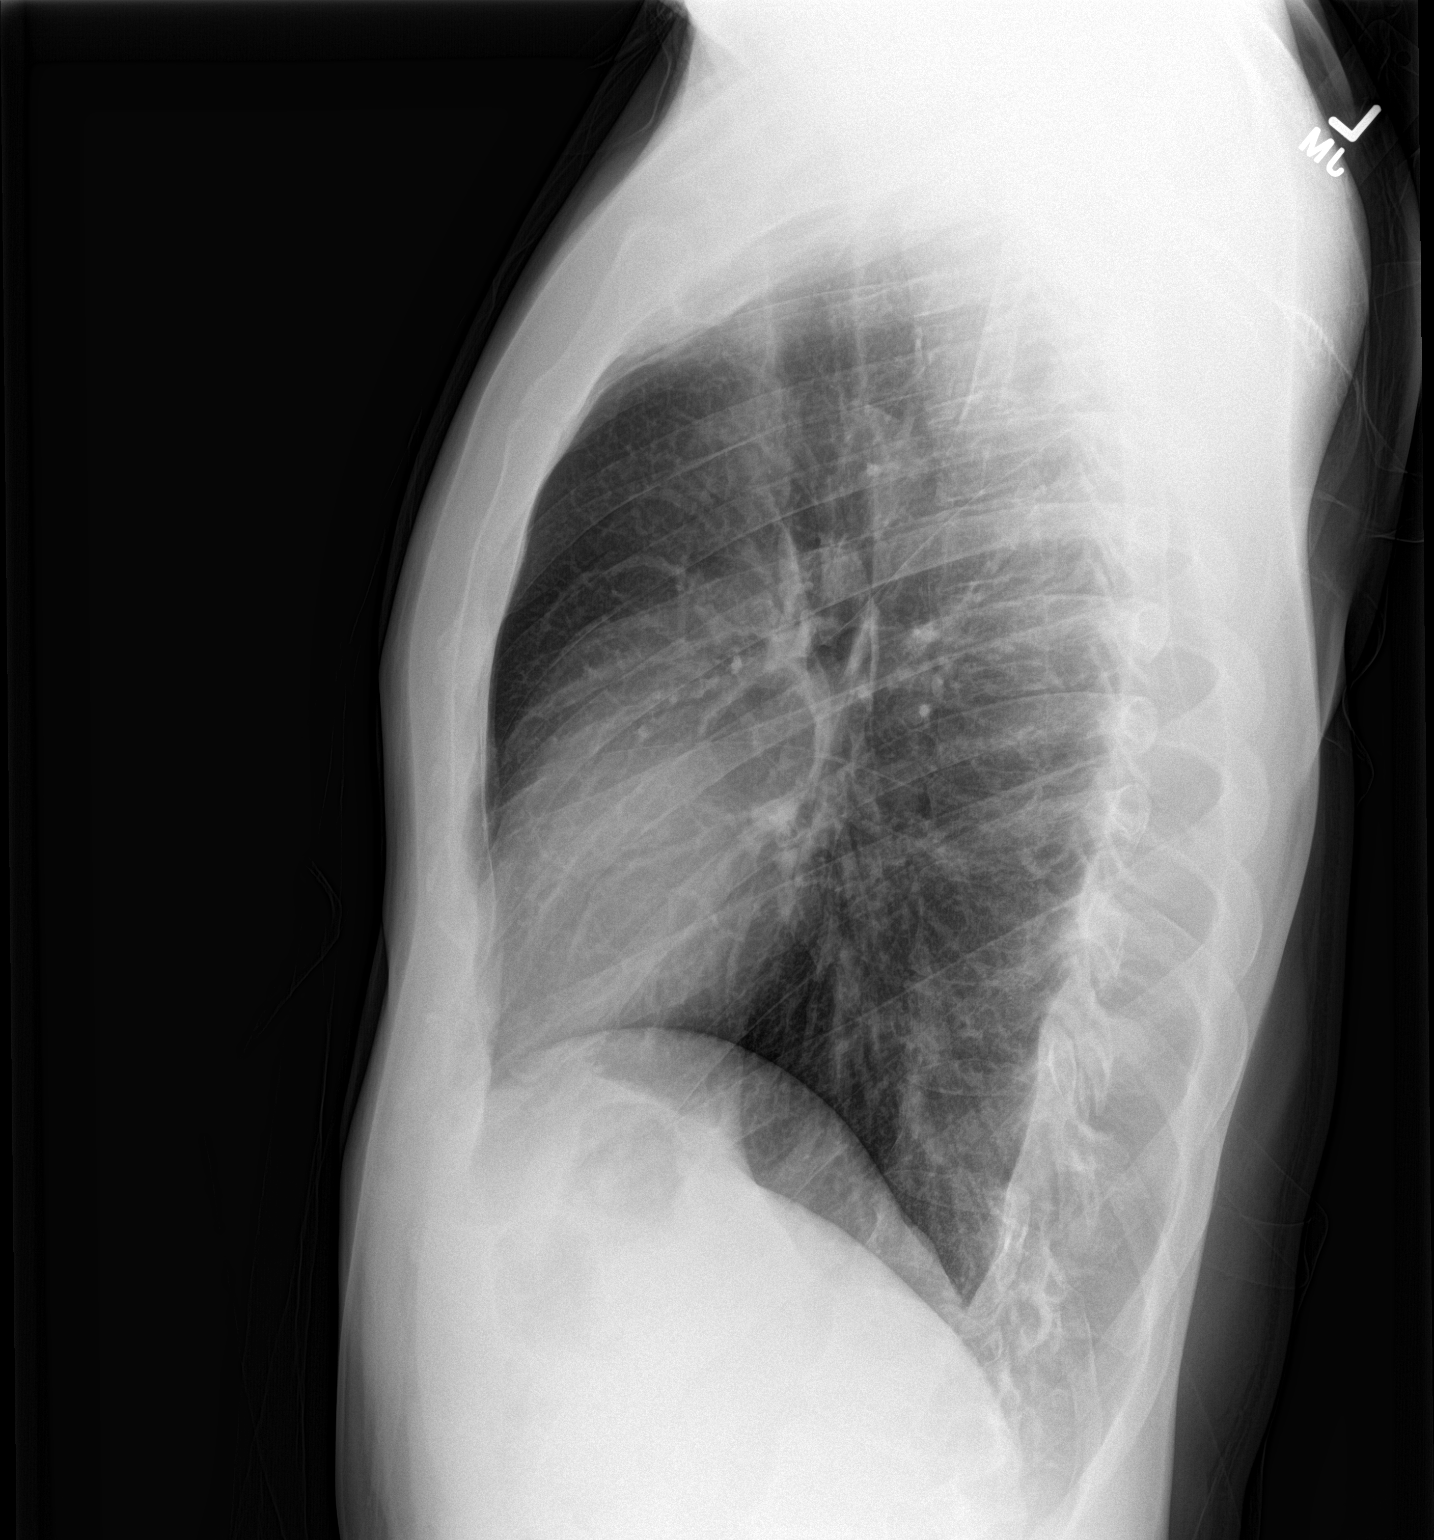

[2 of 2 positions shown; findings below may reference images not displayed]

FINDINGS: The heart size and mediastinal contours are within normal limits.
Both lungs are clear. The visualized skeletal structures are
unremarkable.
IMPRESSION: No active disease.

## 2019-09-09 ENCOUNTER — Other Ambulatory Visit: Payer: Self-pay

## 2019-09-09 ENCOUNTER — Ambulatory Visit (INDEPENDENT_AMBULATORY_CARE_PROVIDER_SITE_OTHER): Payer: BC Managed Care – PPO | Admitting: Podiatry

## 2019-09-09 DIAGNOSIS — B070 Plantar wart: Secondary | ICD-10-CM | POA: Diagnosis not present

## 2019-09-09 MED ORDER — FLUOROURACIL 5 % EX CREA: TOPICAL_CREAM | Freq: Two times a day (BID) | CUTANEOUS | 2 refills | Status: AC

## 2019-09-09 NOTE — Progress Notes (Signed)
Subjective:   Patient ID: Ethan Washington, male   DOB: 39 y.o.   MRN: [id number]   HPI Patient presents stating he has a lesion on the bottom of his left foot that has been present for about 7 months and is tried over-the-counter medication which only took the top layer off.  States he gets sore when he walks on it and has had history of warts on his hands   Review of Systems  All other systems reviewed and are negative.       Objective:  Physical Exam Vitals and nursing note reviewed.  Constitutional:      Appearance: He is well-developed.  Pulmonary:     Effort: Pulmonary effort is normal.  Musculoskeletal:        General: Normal range of motion.  Skin:    General: Skin is warm.  Neurological:     Mental Status: He is alert.     Neurovascular status found to be intact muscle strength was found to be adequate range of motion within normal limits.  Patient is found to have lesions plantar aspect left fourth metatarsal distal that upon debridement show pinpoint bleeding pain to lateral pressure.  Patient has good digital perfusion well oriented x3     Assessment:  Verruca plantaris plantar aspect left     Plan:  H&P reviewed condition and debrided lesion and applied chemical agent to create immune response with sterile dressing.  Prescribed Efudex for the postoperative period to try to reduce any viral tissue and reappoint 1 month and discussed possible laser if it remains present

## 2019-10-07 ENCOUNTER — Encounter: Payer: Self-pay | Admitting: Podiatry

## 2019-10-07 ENCOUNTER — Ambulatory Visit (INDEPENDENT_AMBULATORY_CARE_PROVIDER_SITE_OTHER): Payer: BC Managed Care – PPO | Admitting: Podiatry

## 2019-10-07 ENCOUNTER — Other Ambulatory Visit: Payer: Self-pay

## 2019-10-07 VITALS — Temp 97.0°F

## 2019-10-07 DIAGNOSIS — B070 Plantar wart: Secondary | ICD-10-CM

## 2019-10-07 NOTE — Progress Notes (Signed)
Subjective:   Patient ID: Ethan Washington, male   DOB: 39 y.o.   MRN: [id number]   HPI Patient states it seems to be improved and I did get a small blister after the initial procedure   ROS      Objective:  Physical Exam  Neurovascular status intact with small blistering of the left plantar foot that upon debridement I did note that there is still some tissue with tiny black dots and mild discomfort     Assessment:  Verruca plantaris left improved but present     Plan:  Debrided remaining tissue and applied chemical agent to create immune response with sterile dressing and explained what to do if any blistering were to occur.  Continue Efudex for several more weeks and be seen back as needed

## 2020-12-08 DIAGNOSIS — Z20822 Contact with and (suspected) exposure to covid-19: Secondary | ICD-10-CM | POA: Diagnosis not present

## 2021-11-07 DIAGNOSIS — H40013 Open angle with borderline findings, low risk, bilateral: Secondary | ICD-10-CM | POA: Diagnosis not present

## 2021-11-07 DIAGNOSIS — H5213 Myopia, bilateral: Secondary | ICD-10-CM | POA: Diagnosis not present

## 2022-06-13 ENCOUNTER — Telehealth: Payer: Self-pay | Admitting: Medical

## 2022-06-13 NOTE — Telephone Encounter (Signed)
Patient called to schedule his physical. Last OV in 2016. Patient would like to reestablish care. Please advise.

## 2022-06-13 NOTE — Telephone Encounter (Signed)
Ok for pt to re-establish. °

## 2022-07-11 ENCOUNTER — Encounter: Payer: Self-pay | Admitting: Medical

## 2022-07-11 ENCOUNTER — Other Ambulatory Visit: Payer: Self-pay | Admitting: Medical

## 2022-07-11 ENCOUNTER — Ambulatory Visit (INDEPENDENT_AMBULATORY_CARE_PROVIDER_SITE_OTHER): Payer: BC Managed Care – PPO | Admitting: Medical

## 2022-07-11 VITALS — BP 137/86 | HR 74 | Temp 97.7°F | Resp 18 | Ht 70.0 in | Wt 171.4 lb

## 2022-07-11 DIAGNOSIS — E785 Hyperlipidemia, unspecified: Secondary | ICD-10-CM

## 2022-07-11 DIAGNOSIS — K219 Gastro-esophageal reflux disease without esophagitis: Secondary | ICD-10-CM

## 2022-07-11 DIAGNOSIS — Z0000 Encounter for general adult medical examination without abnormal findings: Secondary | ICD-10-CM

## 2022-07-11 LAB — COMPREHENSIVE METABOLIC PANEL
ALT: 32 U/L (ref 0–53)
AST: 19 U/L (ref 0–37)
Albumin: 4.3 g/dL (ref 3.5–5.2)
Alkaline Phosphatase: 69 U/L (ref 39–117)
BUN: 13 mg/dL (ref 6–23)
CO2: 33 mEq/L — ABNORMAL HIGH (ref 19–32)
Calcium: 9.2 mg/dL (ref 8.4–10.5)
Chloride: 100 mEq/L (ref 96–112)
Creatinine, Ser: 1.2 mg/dL (ref 0.40–1.50)
GFR: 74.74 mL/min (ref >60.00)
Glucose, Bld: 91 mg/dL (ref 70–99)
Potassium: 4.1 mEq/L (ref 3.5–5.1)
Sodium: 137 mEq/L (ref 135–145)
Total Bilirubin: 0.6 mg/dL (ref 0.2–1.2)
Total Protein: 6.3 g/dL (ref 6.0–8.3)

## 2022-07-11 LAB — CBC WITH DIFFERENTIAL/PLATELET
Basophils Absolute: 0 10*3/uL (ref 0.0–0.1)
Basophils Relative: 0.4 % (ref 0.0–3.0)
Eosinophils Absolute: 0.1 10*3/uL (ref 0.0–0.7)
Eosinophils Relative: 1.2 % (ref 0.0–5.0)
HCT: 47.2 % (ref 39.0–52.0)
Hemoglobin: 15.9 g/dL (ref 13.0–17.0)
Lymphocytes Relative: 25.1 % (ref 12.0–46.0)
Lymphs Abs: 1.4 10*3/uL (ref 0.7–4.0)
MCHC: 33.6 g/dL (ref 30.0–36.0)
MCV: 90.9 fl (ref 78.0–100.0)
Monocytes Absolute: 0.4 10*3/uL (ref 0.1–1.0)
Monocytes Relative: 7.8 % (ref 3.0–12.0)
Neutro Abs: 3.7 10*3/uL (ref 1.4–7.7)
Neutrophils Relative %: 65.5 % (ref 43.0–77.0)
Platelets: 238 10*3/uL (ref 150.0–400.0)
RBC: 5.2 Mil/uL (ref 4.22–5.81)
RDW: 12.6 % (ref 11.5–15.5)
WBC: 5.6 10*3/uL (ref 4.0–10.5)

## 2022-07-11 LAB — LIPID PANEL
Cholesterol: 274 mg/dL — ABNORMAL HIGH (ref 0–200)
HDL: 56.1 mg/dL (ref >39.00)
LDL Cholesterol: 198 mg/dL — ABNORMAL HIGH (ref 0–99)
NonHDL: 218.28
Total CHOL/HDL Ratio: 5
Triglycerides: 99 mg/dL (ref 0.0–149.0)
VLDL: 19.8 mg/dL (ref 0.0–40.0)

## 2022-07-11 MED ORDER — FAMOTIDINE 20 MG PO TABS: 20.0000 mg | ORAL_TABLET | Freq: Two times a day (BID) | ORAL | 0 refills | Status: DC

## 2022-07-11 MED ORDER — ATORVASTATIN CALCIUM 10 MG PO TABS: 10.0000 mg | ORAL_TABLET | Freq: Every day | ORAL | 3 refills | Status: DC

## 2022-07-11 NOTE — Patient Instructions (Addendum)
For you wellness exam today I have ordered cbc, cmp and  lipid panel.  Vaccine up to date.  Recommend exercise and healthy diet.  We will let you know lab results as they come in.  Follow up date appointment will be determined after lab review.    For probable gerd- recommend eating healthy and start famotadine 20 mg twice daily.  For elevated blood pressure check bp daily over next week. Want to make sure not going over 140/90.  We discussed your family history. Will follow cholesterol. Will likely rx statin.  If any chest pain significant then be seen in ED. On follow up in month may get EKG.  Preventive Care 42-19 Years Old, Male Preventive care refers to lifestyle choices and visits with your health care provider that can promote health and wellness. Preventive care visits are also called wellness exams. What can I expect for my preventive care visit? Counseling During your preventive care visit, your health care provider may ask about your: Medical history, including: Past medical problems. Family medical history. Current health, including: Emotional well-being. Home life and relationship well-being. Sexual activity. Lifestyle, including: Alcohol, nicotine or tobacco, and drug use. Access to firearms. Diet, exercise, and sleep habits. Safety issues such as seatbelt and bike helmet use. Sunscreen use. Work and work Astronomer. Physical exam Your health care provider will check your: Height and weight. These may be used to calculate your BMI (body mass index). BMI is a measurement that tells if you are at a healthy weight. Waist circumference. This measures the distance around your waistline. This measurement also tells if you are at a healthy weight and may help predict your risk of certain diseases, such as type 2 diabetes and high blood pressure. Heart rate and blood pressure. Body temperature. Skin for abnormal spots. What immunizations do I need?  Vaccines are  usually given at various ages, according to a schedule. Your health care provider will recommend vaccines for you based on your age, medical history, and lifestyle or other factors, such as travel or where you work. What tests do I need? Screening Your health care provider may recommend screening tests for certain conditions. This may include: Lipid and cholesterol levels. Diabetes screening. This is done by checking your blood sugar (glucose) after you have not eaten for a while (fasting). Hepatitis B test. Hepatitis C test. HIV (human immunodeficiency virus) test. STI (sexually transmitted infection) testing, if you are at risk. Lung cancer screening. Prostate cancer screening. Colorectal cancer screening. Talk with your health care provider about your test results, treatment options, and if necessary, the need for more tests. Follow these instructions at home: Eating and drinking  Eat a diet that includes fresh fruits and vegetables, whole grains, lean protein, and low-fat dairy products. Take vitamin and mineral supplements as recommended by your health care provider. Do not drink alcohol if your health care provider tells you not to drink. If you drink alcohol: Limit how much you have to 0-2 drinks a day. Know how much alcohol is in your drink. In the [place] one drink equals one 12 oz bottle of beer (355 mL), one 5 oz glass of wine (148 mL), or one 1 oz glass of hard liquor (44 mL). Lifestyle Brush your teeth every morning and night with fluoride toothpaste. Floss one time each day. Exercise for at least 30 minutes 5 or more days each week. Do not use any products that contain nicotine or tobacco. These products include cigarettes, chewing tobacco, and  vaping devices, such as e-cigarettes. If you need help quitting, ask your health care provider. Do not use drugs. If you are sexually active, practice safe sex. Use a condom or other form of protection to prevent STIs. Take aspirin  only as told by your health care provider. Make sure that you understand how much to take and what form to take. Work with your health care provider to find out whether it is safe and beneficial for you to take aspirin daily. Find healthy ways to manage stress, such as: Meditation, yoga, or listening to music. Journaling. Talking to a trusted person. Spending time with friends and family. Minimize exposure to UV radiation to reduce your risk of skin cancer. Safety Always wear your seat belt while driving or riding in a vehicle. Do not drive: If you have been drinking alcohol. Do not ride with someone who has been drinking. When you are tired or distracted. While texting. If you have been using any mind-altering substances or drugs. Wear a helmet and other protective equipment during sports activities. If you have firearms in your house, make sure you follow all gun safety procedures. What's next? Go to your health care provider once a year for an annual wellness visit. Ask your health care provider how often you should have your eyes and teeth checked. Stay up to date on all vaccines. This information is not intended to replace advice given to you by your health care provider. Make sure you discuss any questions you have with your health care provider. Document Revised: 02/06/2021 Document Reviewed: 02/06/2021 Elsevier Patient Education  2023 [employer].

## 2022-07-11 NOTE — Progress Notes (Signed)
Subjective:    Patient ID: Ethan Washington, male    DOB: 1981/06/13, 41 y.o.   MRN: [id number]  HPI  Pt in to establish care again. I had seen pt in 2016. None since.  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 Has been exercising 3 days a week at gym. Eating healthy. Non smoker and no alcohol. Drinking 3 cups of coffee a day.  Hx of high cholesterol and asthma.   Mom had MI, stroke and myocarditis. His mom had hx of smoking in past.  Asthma as child. Now mostly when gets colds/uri.  Pt already had flu vaccine.    Review of Systems  Constitutional:  Negative for chills and fever.  HENT:  Negative for congestion and ear discharge.   Respiratory:  Negative for cough, chest tightness and wheezing.   Cardiovascular:  Negative for chest pain.  Gastrointestinal:  Positive for abdominal pain. Negative for anal bleeding, constipation, nausea and vomiting.       Describes reflux 2 weeks ago. This occurred after 4-5 cups of coffee.   Genitourinary:  Negative for dysuria, flank pain and frequency.  Musculoskeletal:  Negative for back pain, joint swelling and myalgias.  Skin:  Negative for rash.  Neurological:  Negative for dizziness, syncope, speech difficulty, weakness, numbness and headaches.  Hematological:  Negative for adenopathy. Does not bruise/bleed easily.  Psychiatric/Behavioral:  Negative for behavioral problems and decreased concentration.    Past Medical History:  Diagnosis Date   Exercise induced bronchospasm    PMH of   Proteinuria      Social History   Socioeconomic History   Marital status: Married    Spouse name: Not on file   Number of children: 2   Years of education: Not on file   Highest education level: Not on file  Occupational History    Comment: [employer]  Tobacco Use   Smoking status: Never   Smokeless tobacco: Never  Substance and Sexual Activity   Alcohol use: Yes    Comment:  2 wine  / week or <   Drug use: No   Sexual activity: Not on file  Other Topics Concern   Not on file  Social History Narrative   Not on file   Social Determinants of Health   Financial Resource Strain: Not on file  Food Insecurity: Not on file  Transportation Needs: Not on file  Physical Activity: Not on file  Stress: Not on file  Social Connections: Not on file  Intimate Partner Violence: Not on file    Past Surgical History:  Procedure Laterality Date   REFRACTIVE SURGERY Bilateral    TONSILLECTOMY AND ADENOIDECTOMY     WISDOM TOOTH EXTRACTION      Family History  Problem Relation Age of Onset   Stroke Mother 68   Heart disease Mother        myocarditis   Cancer Maternal Grandfather        cns   Diabetes Maternal Grandfather    Lung cancer Paternal Grandmother        smoker   Diabetes Paternal Grandfather    Hypertension Neg Hx    Kidney disease Neg Hx     No Known Allergies  Current Outpatient Medications on File Prior to Visit  Medication Sig Dispense Refill   albuterol (PROVENTIL HFA;VENTOLIN HFA) 108 (90 BASE) MCG/ACT inhaler Inhale 2 puffs into the lungs every 6 (six) hours as needed for  wheezing or shortness of breath. 1 Inhaler 0   fluorouracil (EFUDEX) 5 % cream Apply topically 2 (two) times daily. 40 g 2   Multiple Vitamins-Minerals (MULTIVITAMIN WITH MINERALS) tablet Take 1 tablet by mouth daily.     atorvastatin (LIPITOR) 10 MG tablet Take 1 tablet (10 mg total) by mouth daily. (Patient not taking: Reported on 07/11/2022) 90 tablet 0   No current facility-administered medications on file prior to visit.    BP 137/86   Pulse 74   Temp 97.7 F (36.5 C) (Oral)   Resp 18   Ht 5\' 10"  (1.778 m)   Wt 171 lb 6.4 oz (77.7 kg)   SpO2 96%   BMI 24.59 kg/m           Objective:   Physical Exam  General Mental Status- Alert. General Appearance- Not in acute distress.   Skin General: Color- Normal Color. Moisture- Normal Moisture.  Neck Carotid  Arteries- Normal color. Moisture- Normal Moisture. No carotid bruits. No JVD.  Chest and Lung Exam Auscultation: Breath Sounds:-Normal.  Cardiovascular Auscultation:Rythm- Regular. Murmurs & Other Heart Sounds:Auscultation of the heart reveals- No Murmurs.  Abdomen Inspection:-Inspeection Normal. Palpation/Percussion:Note:No mass. Palpation and Percussion of the abdomen reveal- Non Tender, Non Distended + BS, no rebound or guarding.   Neurologic Cranial Nerve exam:- CN III-XII intact(No nystagmus), symmetric smile. Strength:- 5/5 equal and symmetric strength both upper and lower extremities.       Assessment & Plan:   Patient Instructions  For you wellness exam today I have ordered cbc, cmp and  lipid panel.  Vaccine up to date.  Recommend exercise and healthy diet.  We will let you know lab results as they come in.  Follow up date appointment will be determined after lab review.    For probable gerd- recommend eating healthy and start famotadine 20 mg twice daily.  For elevated blood pressure check bp daily over next week. Want to make sure not going over 140/90.  We discussed your family history. Will follow cholesterol. Will likely rx statin.  If any chest pain significant then be seen in ED. On follow up in month may get EKG.       , PA-C

## 2022-07-11 NOTE — Addendum Note (Signed)
Addended by: Gwenevere Abbot on: 07/11/2022 09:35 PM   Modules accepted: Orders

## 2022-07-14 NOTE — Telephone Encounter (Signed)
pharmacy comment: Alternative Requested:THE PRESCRIBED MEDICATION IS NOT COVERED BY INSURANCE. PLEASE CONSIDER CHANGING TO ONE OF THE SUGGESTED COVERED ALTERNATIVES.   All Pharmacy Suggested Alternatives:  cimetidine (TAGAMET) 400 MG tablet nizatidine (AXID) 150 MG capsule

## 2022-07-29 ENCOUNTER — Encounter: Payer: Self-pay | Admitting: Medical

## 2022-08-19 ENCOUNTER — Ambulatory Visit: Payer: BC Managed Care – PPO | Admitting: Medical

## 2022-08-21 ENCOUNTER — Encounter: Payer: Self-pay | Admitting: Medical

## 2022-08-21 ENCOUNTER — Ambulatory Visit (INDEPENDENT_AMBULATORY_CARE_PROVIDER_SITE_OTHER): Payer: BC Managed Care – PPO | Admitting: Medical

## 2022-08-21 VITALS — BP 127/82 | HR 79 | Temp 98.3°F | Ht 70.0 in | Wt 166.0 lb

## 2022-08-21 DIAGNOSIS — E785 Hyperlipidemia, unspecified: Secondary | ICD-10-CM | POA: Diagnosis not present

## 2022-08-21 DIAGNOSIS — K219 Gastro-esophageal reflux disease without esophagitis: Secondary | ICD-10-CM | POA: Diagnosis not present

## 2022-08-21 NOTE — Progress Notes (Signed)
Subjective:    Patient ID: Ethan Washington, male    DOB: April 24, 1981, 41 y.o.   MRN: [id number]  HPI  Pt in for follow up.  Pt states stomach/gerd symptoms are much better. He states he stopped coffee recently one month ago and noticed stomach symptoms resolved. Now just drinking test. Pt has still been using famotadine. Issue at pharmacy. Got med over the counter. Not having to use frequently.  High cholesterol-  on atorvastatin 10 mg daily. Given refill for one year.   Pt has been checking his bp at home. Most of time 127/85 on average. Very rarely will be in 140 range systolic.    Review of Systems  Constitutional:  Negative for chills, fatigue and fever.  Respiratory:  Negative for cough, chest tightness, shortness of breath and wheezing.   Cardiovascular:  Negative for chest pain and palpitations.  Gastrointestinal:  Negative for abdominal pain.  Genitourinary:  Negative for dysuria, flank pain, frequency and penile pain.  Musculoskeletal:  Negative for back pain and joint swelling.  Neurological:  Negative for dizziness, speech difficulty, weakness, numbness and headaches.  Hematological:  Negative for adenopathy. Does not bruise/bleed easily.  Psychiatric/Behavioral:  Negative for behavioral problems and decreased concentration.     Past Medical History:  Diagnosis Date   Exercise induced bronchospasm    PMH of   Proteinuria      Social History   Socioeconomic History   Marital status: Married    Spouse name: Not on file   Number of children: 2   Years of education: Not on file   Highest education level: Not on file  Occupational History    Comment: [employer]  Tobacco Use   Smoking status: Never   Smokeless tobacco: Never  Substance and Sexual Activity   Alcohol use: Yes    Comment:  2 wine / week or <   Drug use: No   Sexual activity: Not on file  Other Topics Concern   Not on file  Social History Narrative   Not on file   Social  Determinants of Health   Financial Resource Strain: Not on file  Food Insecurity: Not on file  Transportation Needs: Not on file  Physical Activity: Not on file  Stress: Not on file  Social Connections: Not on file  Intimate Partner Violence: Not on file    Past Surgical History:  Procedure Laterality Date   REFRACTIVE SURGERY Bilateral    TONSILLECTOMY AND ADENOIDECTOMY     WISDOM TOOTH EXTRACTION      Family History  Problem Relation Age of Onset   Stroke Mother 78   Heart disease Mother        myocarditis   Cancer Maternal Grandfather        cns   Diabetes Maternal Grandfather    Lung cancer Paternal Grandmother        smoker   Diabetes Paternal Grandfather    Hypertension Neg Hx    Kidney disease Neg Hx     No Known Allergies  Current Outpatient Medications on File Prior to Visit  Medication Sig Dispense Refill   albuterol (PROVENTIL HFA;VENTOLIN HFA) 108 (90 BASE) MCG/ACT inhaler Inhale 2 puffs into the lungs every 6 (six) hours as needed for wheezing or shortness of breath. 1 Inhaler 0   atorvastatin (LIPITOR) 10 MG tablet Take 1 tablet (10 mg total) by mouth daily. 90 tablet 3   cimetidine (TAGAMET) 400 MG tablet Take 1 tablet (400 mg  total) by mouth 2 (two) times daily. 90 tablet 0   Multiple Vitamins-Minerals (MULTIVITAMIN WITH MINERALS) tablet Take 1 tablet by mouth daily.     fluorouracil (EFUDEX) 5 % cream Apply topically 2 (two) times daily. (Patient not taking: Reported on 08/21/2022) 40 g 2   No current facility-administered medications on file prior to visit.    BP 127/82   Pulse 79   Temp 98.3 F (36.8 C) (Oral)   Ht 5\' 10"  (1.778 m)   Wt 166 lb (75.3 kg)   SpO2 100%   BMI 23.82 kg/m          Objective:   Physical Exam  General Mental Status- Alert. General Appearance- Not in acute distress.   Skin General: Color- Normal Color. Moisture- Normal Moisture.  Neck Carotid Arteries- Normal color. Moisture- Normal Moisture. No carotid  bruits. No JVD.  Chest and Lung Exam Auscultation: Breath Sounds:-Normal.  Cardiovascular Auscultation:Rythm- Regular. Murmurs & Other Heart Sounds:Auscultation of the heart reveals- No Murmurs.  Abdomen Inspection:-Inspeection Normal. Palpation/Percussion:Note:No mass. Palpation and Percussion of the abdomen reveal- Non Tender, Non Distended + BS, no rebound or guarding.   Neurologic Cranial Nerve exam:- CN III-XII intact(No nystagmus), symmetric smile. Strength:- 5/5 equal and symmetric strength both upper and lower extremities.       Assessment & Plan:   Patient Instructions  Jerrye Bushy- symptoms are much better with improved diet/less coffee. Recommend healthy diet and just use famotadine/periodically.  Hyperlipidemia- continue low cholesterol diet and atorvastatin.  Your blood pressure readings are in good range when you check at home. If they begin to trend upward toward 140/90 then would recommend medication.  Follow up in 6 month or sooner if needed.   Mackie Pai, PA-C

## 2022-08-21 NOTE — Patient Instructions (Signed)
Ethan Washington- symptoms are much better with improved diet/less coffee. Recommend healthy diet and just use famotadine/periodically.  Hyperlipidemia- continue low cholesterol diet and atorvastatin.  Your blood pressure readings are in good range when you check at home. If they begin to trend upward toward 140/90 then would recommend medication.  Follow up in 6 month or sooner if needed.

## 2022-11-07 DIAGNOSIS — H5213 Myopia, bilateral: Secondary | ICD-10-CM | POA: Diagnosis not present

## 2023-01-30 ENCOUNTER — Ambulatory Visit (INDEPENDENT_AMBULATORY_CARE_PROVIDER_SITE_OTHER): Payer: BC Managed Care – PPO | Admitting: Medical

## 2023-01-30 VITALS — BP 132/88 | HR 66 | Resp 18 | Ht 70.0 in | Wt 170.8 lb

## 2023-01-30 DIAGNOSIS — K219 Gastro-esophageal reflux disease without esophagitis: Secondary | ICD-10-CM | POA: Diagnosis not present

## 2023-01-30 DIAGNOSIS — E785 Hyperlipidemia, unspecified: Secondary | ICD-10-CM

## 2023-01-30 DIAGNOSIS — Z23 Encounter for immunization: Secondary | ICD-10-CM | POA: Diagnosis not present

## 2023-01-30 LAB — LIPID PANEL
Cholesterol: 173 mg/dL (ref 0–200)
HDL: 56.7 mg/dL (ref >39.00)
LDL Cholesterol: 100 mg/dL — ABNORMAL HIGH (ref 0–99)
NonHDL: 115.94
Total CHOL/HDL Ratio: 3
Triglycerides: 79 mg/dL (ref 0.0–149.0)
VLDL: 15.8 mg/dL (ref 0.0–40.0)

## 2023-01-30 LAB — COMPREHENSIVE METABOLIC PANEL
ALT: 33 U/L (ref 0–53)
AST: 24 U/L (ref 0–37)
Albumin: 4.3 g/dL (ref 3.5–5.2)
Alkaline Phosphatase: 82 U/L (ref 39–117)
BUN: 17 mg/dL (ref 6–23)
CO2: 31 mEq/L (ref 19–32)
Calcium: 9.2 mg/dL (ref 8.4–10.5)
Chloride: 102 mEq/L (ref 96–112)
Creatinine, Ser: 1.21 mg/dL (ref 0.40–1.50)
GFR: 73.71 mL/min (ref >60.00)
Glucose, Bld: 88 mg/dL (ref 70–99)
Potassium: 3.8 mEq/L (ref 3.5–5.1)
Sodium: 140 mEq/L (ref 135–145)
Total Bilirubin: 0.9 mg/dL (ref 0.2–1.2)
Total Protein: 6.4 g/dL (ref 6.0–8.3)

## 2023-01-30 NOTE — Addendum Note (Signed)
Addended by: Maximino Sarin on: 01/30/2023 09:13 AM   Modules accepted: Orders

## 2023-01-30 NOTE — Patient Instructions (Addendum)
Gastroesophageal Reflux Disease (GERD(for most part resolved): Improved with cessation of coffee and switch to caffeine-free herbal tea. No discomfort reported. Famotidine used as needed. -Continue current dietary modifications and use of Famotidine as needed.  Hyperlipidemia: Last cholesterol level was 274 in November. Patient has made dietary changes including increased fish intake and reduced beef consumption. Currently on Lipitor. -Check lipid panel today to assess response to dietary changes and Lipitor. -Continue current dietary modifications and Lipitor.  Hypertension: Blood pressure readings have been in the high 120s to low 130s systolic and around 80-84 diastolic. Patient is active with daily walking. -Continue current exercise regimen and low salt diet. -Check blood pressure 1-2 times per week, avoiding times of stress or immediately post-exercise.    -Administer tetanus vaccine today. -Check metabolic panel today. -Consider flu vaccine  n the fall as  nurse visit or at pharmacy. -Discuss COVID vaccine at next visit. -Schedule follow-up visit in 6-8 months.

## 2023-01-30 NOTE — Progress Notes (Signed)
Subjective:    Patient ID: Ethan Washington, male    DOB: December 15, 1980, 42 y.o.   MRN: [id number]  HPI Discussed the use of AI scribe software for clinical note transcription with the patient, who gave verbal consent to proceed.  History of Present Illness   The patient, with a history of hyperlipidemia and hypertension, initially presented with prior heartburn and reflux. He reported that the discomfort was alleviated by famotidine, but he decided to experiment with eliminating coffee from his diet. This change resulted in complete resolution of his discomfort, improved sleep, and normalized bowel movements. He has since switched to caffeine-free herbal teas, with peppermint in the morning and chamomile or lavender at night, and reports feeling significantly better overall.  In addition to dietary changes, the patient has made lifestyle modifications to manage his hyperlipidemia. He has increased his fish intake, drastically reduced beef consumption, and increased physical activity to daily walks of five to six miles and regular push-ups. He reports no adverse effects from these changes.  The patient also reported changes in his blood pressure readings, which have been hovering in the high 120s to low 130s systolic and around 80-84 diastolic. He has been monitoring his heart rate during exercise, which remains around 100 bpm at a brisk walking pace. He has been following a low salt diet to help control his blood pressure.  Lastly, the patient reported occasional reflux, which he manages with a restricted diet and cessation of caffeine. He has been monitoring his blood pressure at home, typically once or twice a week, unless he feels symptomatic.        Review of Systems  Constitutional:  Negative for chills, fatigue and fever.  Respiratory:  Negative for chest tightness, shortness of breath and wheezing.   Cardiovascular:  Negative for chest pain and palpitations.  Gastrointestinal:  Negative  for abdominal pain.  Musculoskeletal:  Negative for back pain, myalgias and neck pain.  Hematological:  Negative for adenopathy. Does not bruise/bleed easily.  Psychiatric/Behavioral:  Negative for confusion.     Past Medical History:  Diagnosis Date   Exercise induced bronchospasm    PMH of   Proteinuria      Social History   Socioeconomic History   Marital status: Married    Spouse name: Not on file   Number of children: 2   Years of education: Not on file   Highest education level: Not on file  Occupational History    Comment: [employer]  Tobacco Use   Smoking status: Never   Smokeless tobacco: Never  Substance and Sexual Activity   Alcohol use: Yes    Comment:  2 wine / week or <   Drug use: No   Sexual activity: Not on file  Other Topics Concern   Not on file  Social History Narrative   Not on file   Social Determinants of Health   Financial Resource Strain: Not on file  Food Insecurity: Not on file  Transportation Needs: Not on file  Physical Activity: Not on file  Stress: Not on file  Social Connections: Not on file  Intimate Partner Violence: Not on file    Past Surgical History:  Procedure Laterality Date   REFRACTIVE SURGERY Bilateral    TONSILLECTOMY AND ADENOIDECTOMY     WISDOM TOOTH EXTRACTION      Family History  Problem Relation Age of Onset   Stroke Mother 18   Heart disease Mother  myocarditis   Cancer Maternal Grandfather        cns   Diabetes Maternal Grandfather    Lung cancer Paternal Grandmother        smoker   Diabetes Paternal Grandfather    Hypertension Neg Hx    Kidney disease Neg Hx     No Known Allergies  Current Outpatient Medications on File Prior to Visit  Medication Sig Dispense Refill   albuterol (PROVENTIL HFA;VENTOLIN HFA) 108 (90 BASE) MCG/ACT inhaler Inhale 2 puffs into the lungs every 6 (six) hours as needed for wheezing or shortness of breath. 1 Inhaler 0   atorvastatin (LIPITOR) 10  MG tablet Take 1 tablet (10 mg total) by mouth daily. 90 tablet 3   cimetidine (TAGAMET) 400 MG tablet Take 1 tablet (400 mg total) by mouth 2 (two) times daily. 90 tablet 0   fluorouracil (EFUDEX) 5 % cream Apply topically 2 (two) times daily. (Patient not taking: Reported on 08/21/2022) 40 g 2   Multiple Vitamins-Minerals (MULTIVITAMIN WITH MINERALS) tablet Take 1 tablet by mouth daily.     No current facility-administered medications on file prior to visit.    BP 132/88   Pulse 66   Resp 18   Ht 5\' 10"  (1.778 m)   Wt 170 lb 12.8 oz (77.5 kg)   SpO2 97%   BMI 24.51 kg/m        Objective:   Physical Exam  General Mental Status- Alert. General Appearance- Not in acute distress.    Neck Carotid Arteries- Normal color. Moisture- Normal Moisture. No carotid bruits. No JVD.  Chest and Lung Exam Auscultation: Breath Sounds:-Normal.  Cardiovascular Auscultation:Rythm- Regular. Murmurs & Other Heart Sounds:Auscultation of the heart reveals- No Murmurs.  Abdomen Inspection:-Inspeection Normal. Palpation/Percussion:Note:No mass. Palpation and Percussion of the abdomen reveal- Non Tender, Non Distended + BS, no rebound or guarding.   Neurologic Cranial Nerve exam:- CN III-XII intact(No nystagmus), symmetric smile. - Normal/Intact Strength:- 5/5 equal and symmetric strength both upper and lower extremities.       Assessment & Plan:   Assessment and Plan    Gastroesophageal Reflux Disease (GERD): Improved with cessation of coffee and switch to caffeine-free herbal tea. No discomfort reported. Famotidine used as needed. -Continue current dietary modifications and use of Famotidine as needed.  Hyperlipidemia: Last cholesterol level was 274 in November. Patient has made dietary changes including increased fish intake and reduced beef consumption. Currently on Lipitor. -Check lipid panel today to assess response to dietary changes and Lipitor. -Continue current dietary  modifications and Lipitor.  Hypertension: Blood pressure readings have been in the high 120s to low 130s systolic and around 80-84 diastolic. Patient is active with daily walking. -Continue current exercise regimen and low salt diet. -Check blood pressure 1-2 times per week, avoiding times of stress or immediately post-exercise.    -Administer tetanus vaccine today. -Check metabolic panel today. -Consider flu vaccine  n the fall as  nurse visit or at pharmacy. -Discuss COVID vaccine at next visit. -Schedule follow-up visit in 6-8 months.        Esperanza Richters, PA-C

## 2023-05-19 ENCOUNTER — Ambulatory Visit: Payer: BC Managed Care – PPO | Admitting: Medical

## 2023-05-19 VITALS — BP 120/98 | HR 96 | Temp 99.4°F | Resp 18 | Ht 70.0 in | Wt 162.6 lb

## 2023-05-19 DIAGNOSIS — R197 Diarrhea, unspecified: Secondary | ICD-10-CM

## 2023-05-19 DIAGNOSIS — R110 Nausea: Secondary | ICD-10-CM

## 2023-05-19 DIAGNOSIS — R051 Acute cough: Secondary | ICD-10-CM

## 2023-05-19 DIAGNOSIS — R748 Abnormal levels of other serum enzymes: Secondary | ICD-10-CM

## 2023-05-19 LAB — POC COVID19 BINAXNOW: SARS Coronavirus 2 Ag: NEGATIVE

## 2023-05-19 MED ORDER — METRONIDAZOLE 500 MG PO TABS: 500.0000 mg | ORAL_TABLET | Freq: Three times a day (TID) | ORAL | 0 refills | Status: AC

## 2023-05-19 MED ORDER — FLUTICASONE PROPIONATE 50 MCG/ACT NA SUSP: 2.0000 | Freq: Every day | NASAL | 1 refills | Status: DC

## 2023-05-19 MED ORDER — ATORVASTATIN CALCIUM 10 MG PO TABS: 10.0000 mg | ORAL_TABLET | Freq: Every day | ORAL | 3 refills | Status: DC

## 2023-05-19 MED ORDER — BENZONATATE 100 MG PO CAPS: 100.0000 mg | ORAL_CAPSULE | Freq: Three times a day (TID) | ORAL | 0 refills | Status: AC | PRN

## 2023-05-19 NOTE — Patient Instructions (Addendum)
Diarrhea for 2 weeks. At this point think stool culture or c dif would be positive Persistent diarrhea, nausea, and loss of appetite for two weeks following a meal at a fast food restaurant. No vomiting or muscle cramps. Stools are mostly liquid. Limited relief with Imodium and some improvement with probiotics from [place] yogurt. -Collect stool samples for culture and sensitivity, C. Diff, and ova and parasites. -Order CBC and metabolic panel. -Start Flagyl pending stool test results. cover dif. but may need to change tx after cultures/studies. -Continue hydration and bland diet.  Cough Dry cough with some nasal congestion, possibly related to a URI or allergies. No productive cough or sinus pressure. -Prescribe benzonatate for cough. -Prescribe Flonase for nasal congestion. -Advise patient to report if cough becomes productive or if sinus pressure develops.  Follow up date to be determined after lab and stool study review.

## 2023-05-19 NOTE — Progress Notes (Signed)
Subjective:    Patient ID: Ethan Washington, male    DOB: [birth year], 42 y.o.   MRN: [id number]  HPI  Discussed the use of AI scribe software for clinical note transcription with the patient, who gave verbal consent to proceed.  History of Present Illness   The patient, with a recent travel history to [place], [place], began experiencing gastrointestinal symptoms approximately two weeks ago. Initially, he felt 'a little weird' the day after consuming a fast-food meal. The symptoms progressed over the next few days, culminating in severe, watery diarrhea with bowel movements every 45 minutes, accompanied by fatigue and loss of appetite. The patient also reported waking up three times a night to use the bathroom. Diarrhea still after 2 weeks. about 3 times a day if he eats normal meals.     The patient also reported a significant loss of appetite, with minimal food intake over the past two weeks, except for a few instances of consuming small amounts of fish, chicken nuggets, and [place] yogurt.   The patient has been managing the diarrhea with Imodium, used sparingly. Despite this, the stools remain largely liquid. He also reported severe nausea, particularly after eating, but denied any episodes of vomiting or muscle cramps. He has been attempting to stay hydrated with Gatorade and other electrolyte supplements.      A week later he experienced a night of profuse sweating and has had a persistent cough and transient nasal congestion. The cough is described as dry, and the nasal congestion has improved some.   The patient's symptoms have persisted despite his attempts at self-management, and he is concerned about the impact on his upcoming work travel. He has had a negative at-home COVID-19 test twice during this period.       Review of Systems  Constitutional:  Negative for chills and fatigue.  HENT:  Positive for congestion and postnasal drip. Negative for ear pain, sinus pressure and  sinus pain.   Respiratory:  Positive for cough. Negative for shortness of breath and wheezing.   Cardiovascular:  Negative for chest pain and palpitations.  Gastrointestinal:  Positive for diarrhea. Negative for abdominal pain, constipation, nausea and vomiting.  Genitourinary:  Negative for dysuria and genital sores.  Musculoskeletal:  Negative for back pain, joint swelling and neck pain.  Skin:  Negative for rash.  Neurological:  Negative for syncope, facial asymmetry and numbness.  Hematological:  Negative for adenopathy. Does not bruise/bleed easily.    Past Medical History:  Diagnosis Date   Exercise induced bronchospasm    PMH of   Proteinuria      Social History   Socioeconomic History   Marital status: Married    Spouse name: Not on file   Number of children: 2   Years of education: Not on file   Highest education level: Not on file  Occupational History    Comment: [employer]  Tobacco Use   Smoking status: Never   Smokeless tobacco: Never  Substance and Sexual Activity   Alcohol use: Yes    Comment:  2 wine / week or <   Drug use: No   Sexual activity: Not on file  Other Topics Concern   Not on file  Social History Narrative   Not on file   Social Determinants of Health   Financial Resource Strain: Not on file  Food Insecurity: Not on file  Transportation Needs: Not on file  Physical Activity: Not on file  Stress: Not on file  Social Connections: Not on file  Intimate Partner Violence: Not on file    Past Surgical History:  Procedure Laterality Date   REFRACTIVE SURGERY Bilateral    TONSILLECTOMY AND ADENOIDECTOMY     WISDOM TOOTH EXTRACTION      Family History  Problem Relation Age of Onset   Stroke Mother 83   Heart disease Mother        myocarditis   Cancer Maternal Grandfather        cns   Diabetes Maternal Grandfather    Lung cancer Paternal Grandmother        smoker   Diabetes Paternal Grandfather    Hypertension Neg  Hx    Kidney disease Neg Hx     No Known Allergies  Current Outpatient Medications on File Prior to Visit  Medication Sig Dispense Refill   albuterol (PROVENTIL HFA;VENTOLIN HFA) 108 (90 BASE) MCG/ACT inhaler Inhale 2 puffs into the lungs every 6 (six) hours as needed for wheezing or shortness of breath. 1 Inhaler 0   Multiple Vitamins-Minerals (MULTIVITAMIN WITH MINERALS) tablet Take 1 tablet by mouth daily.     cimetidine (TAGAMET) 400 MG tablet Take 1 tablet (400 mg total) by mouth 2 (two) times daily. 90 tablet 0   fluorouracil (EFUDEX) 5 % cream Apply topically 2 (two) times daily. (Patient not taking: Reported on 08/21/2022) 40 g 2   No current facility-administered medications on file prior to visit.    BP (!) 120/98 (BP Location: Left Arm, Patient Position: Sitting, Cuff Size: Normal)   Pulse 96   Temp 99.4 F (37.4 C) (Oral)   Resp 18   Ht 5\' 10"  (1.778 m)   Wt 162 lb 9.6 oz (73.8 kg)   SpO2 98%   BMI 23.33 kg/m        Objective:   Physical Exam  General Mental Status- Alert. General Appearance- Not in acute distress.   Skin General: Color- Normal Color. Moisture- Normal Moisture.  Neck Carotid Arteries- Normal color. Moisture- Normal Moisture. No carotid bruits. No JVD.  Chest and Lung Exam Auscultation: Breath Sounds:-Normal.  Cardiovascular Auscultation:Rythm- Regular. Murmurs & Other Heart Sounds:Auscultation of the heart reveals- No Murmurs.  Abdomen Inspection:-Inspeection Normal. Palpation/Percussion:Note:No mass. Palpation and Percussion of the abdomen reveal- Non Tender, Non Distended + BS, no rebound or guarding.   Neurologic Cranial Nerve exam:- CN III-XII intact(No nystagmus), symmetric smile. Strength:- 5/5 equal and symmetric strength both upper and lower extremities.   Heent- no sinus pressure. Canal clear. Normal tm. -+pnd. Boggy turbinates.     Assessment & Plan:  Assessment and Plan    Diarrhea for 2 weeks. At this point think  stool culture or c dif would be positive Persistent diarrhea, nausea, and loss of appetite for two weeks following a meal at a fast food restaurant. No vomiting or muscle cramps. Stools are mostly liquid. Limited relief with Imodium and some improvement with probiotics from [place] yogurt. -Collect stool samples for culture and sensitivity, C. Diff, and ova and parasites. -Order CBC and metabolic panel. -Start Flagyl pending stool test results. cover dif. but may need to change tx after cultures/studies. -Continue hydration and bland diet.  Cough Dry cough with some nasal congestion, possibly related to a URI or allergies. No productive cough or sinus pressure. -Prescribe benzonatate for cough. -Prescribe Flonase for nasal congestion. -Advise patient to report if cough becomes productive or if sinus pressure develops.       Follow up date to be determined after lab and  stool study review.  Esperanza Richters, PA-C

## 2023-05-20 ENCOUNTER — Other Ambulatory Visit: Payer: BC Managed Care – PPO

## 2023-05-20 DIAGNOSIS — R197 Diarrhea, unspecified: Secondary | ICD-10-CM

## 2023-05-20 LAB — COMPREHENSIVE METABOLIC PANEL
ALT: 68 U/L — ABNORMAL HIGH (ref 0–53)
AST: 53 U/L — ABNORMAL HIGH (ref 0–37)
Albumin: 3.9 g/dL (ref 3.5–5.2)
Alkaline Phosphatase: 131 U/L — ABNORMAL HIGH (ref 39–117)
BUN: 15 mg/dL (ref 6–23)
CO2: 32 mEq/L (ref 19–32)
Calcium: 9 mg/dL (ref 8.4–10.5)
Chloride: 99 mEq/L (ref 96–112)
Creatinine, Ser: 1.34 mg/dL (ref 0.40–1.50)
GFR: 65.07 mL/min (ref >60.00)
Glucose, Bld: 96 mg/dL (ref 70–99)
Potassium: 4.2 mEq/L (ref 3.5–5.1)
Sodium: 139 mEq/L (ref 135–145)
Total Bilirubin: 0.5 mg/dL (ref 0.2–1.2)
Total Protein: 6.2 g/dL (ref 6.0–8.3)

## 2023-05-20 LAB — CBC WITH DIFFERENTIAL/PLATELET
Basophils Absolute: 0 10*3/uL (ref 0.0–0.1)
Basophils Relative: 0.6 % (ref 0.0–3.0)
Eosinophils Absolute: 0 10*3/uL (ref 0.0–0.7)
Eosinophils Relative: 0.8 % (ref 0.0–5.0)
HCT: 45.5 % (ref 39.0–52.0)
Hemoglobin: 15 g/dL (ref 13.0–17.0)
Lymphocytes Relative: 25 % (ref 12.0–46.0)
Lymphs Abs: 1.3 10*3/uL (ref 0.7–4.0)
MCHC: 32.9 g/dL (ref 30.0–36.0)
MCV: 89.8 fl (ref 78.0–100.0)
Monocytes Absolute: 0.7 10*3/uL (ref 0.1–1.0)
Monocytes Relative: 14 % — ABNORMAL HIGH (ref 3.0–12.0)
Neutro Abs: 3.2 10*3/uL (ref 1.4–7.7)
Neutrophils Relative %: 59.6 % (ref 43.0–77.0)
Platelets: 266 10*3/uL (ref 150.0–400.0)
RBC: 5.06 Mil/uL (ref 4.22–5.81)
RDW: 12.8 % (ref 11.5–15.5)
WBC: 5.3 10*3/uL (ref 4.0–10.5)

## 2023-05-20 LAB — LIPASE: Lipase: 70 U/L — ABNORMAL HIGH (ref 11.0–59.0)

## 2023-05-20 NOTE — Addendum Note (Signed)
Addended by: Gwenevere Abbot on: 05/20/2023 08:42 PM   Modules accepted: Orders

## 2023-05-20 NOTE — Progress Notes (Signed)
Specimen drop off

## 2023-05-21 ENCOUNTER — Encounter: Payer: Self-pay | Admitting: Medical

## 2023-05-21 ENCOUNTER — Other Ambulatory Visit (INDEPENDENT_AMBULATORY_CARE_PROVIDER_SITE_OTHER): Payer: BC Managed Care – PPO

## 2023-05-21 DIAGNOSIS — R197 Diarrhea, unspecified: Secondary | ICD-10-CM | POA: Diagnosis not present

## 2023-05-21 DIAGNOSIS — R748 Abnormal levels of other serum enzymes: Secondary | ICD-10-CM | POA: Diagnosis not present

## 2023-05-21 LAB — CLOSTRIDIUM DIFFICILE BY PCR: Toxigenic C. Difficile by PCR: NEGATIVE

## 2023-05-22 LAB — COMPREHENSIVE METABOLIC PANEL
ALT: 129 U/L — ABNORMAL HIGH (ref 0–53)
AST: 91 U/L — ABNORMAL HIGH (ref 0–37)
Albumin: 3.9 g/dL (ref 3.5–5.2)
Alkaline Phosphatase: 146 U/L — ABNORMAL HIGH (ref 39–117)
BUN: 11 mg/dL (ref 6–23)
CO2: 31 meq/L (ref 19–32)
Calcium: 8.9 mg/dL (ref 8.4–10.5)
Chloride: 102 meq/L (ref 96–112)
Creatinine, Ser: 1.18 mg/dL (ref 0.40–1.50)
GFR: 75.8 mL/min (ref >60.00)
Glucose, Bld: 83 mg/dL (ref 70–99)
Potassium: 4 meq/L (ref 3.5–5.1)
Sodium: 141 meq/L (ref 135–145)
Total Bilirubin: 0.4 mg/dL (ref 0.2–1.2)
Total Protein: 6.3 g/dL (ref 6.0–8.3)

## 2023-05-22 NOTE — Addendum Note (Signed)
Addended by: Gwenevere Abbot on: 05/22/2023 02:36 PM   Modules accepted: Orders

## 2023-05-24 LAB — STOOL CULTURE
E coli, Shiga toxin Assay: NEGATIVE
Stool Culture result 1 (CMPCXR): NEGATIVE

## 2023-05-25 ENCOUNTER — Telehealth (HOSPITAL_BASED_OUTPATIENT_CLINIC_OR_DEPARTMENT_OTHER): Payer: Self-pay

## 2023-05-25 LAB — ACUTE HEP PANEL AND HEP B SURFACE AB
HEPATITIS C ANTIBODY REFILL$(REFL): NONREACTIVE
Hep A IgM: NONREACTIVE
Hep B C IgM: NONREACTIVE
Hepatitis B Surface Ag: NONREACTIVE

## 2023-05-25 LAB — REFLEX TIQ

## 2023-05-25 MED ORDER — CIPROFLOXACIN HCL 500 MG PO TABS: 500.0000 mg | ORAL_TABLET | Freq: Two times a day (BID) | ORAL | 0 refills | Status: AC

## 2023-05-25 NOTE — Addendum Note (Signed)
Addended by: Gwenevere Abbot on: 05/25/2023 07:27 PM   Modules accepted: Orders

## 2023-05-28 LAB — OVA AND PARASITE EXAMINATION
CONCENTRATE RESULT:: NONE SEEN
MICRO NUMBER:: 15514451
SPECIMEN QUALITY:: ADEQUATE
TRICHROME RESULT:: NONE SEEN

## 2023-06-02 ENCOUNTER — Encounter: Payer: Self-pay | Admitting: Medical

## 2023-06-02 ENCOUNTER — Ambulatory Visit (HOSPITAL_BASED_OUTPATIENT_CLINIC_OR_DEPARTMENT_OTHER)
Admission: RE | Admit: 2023-06-02 | Discharge: 2023-06-02 | Disposition: A | Discharge status: Home / Self Care | Payer: BC Managed Care – PPO | Source: Ambulatory Visit | Attending: Medical | Admitting: Medical

## 2023-06-02 ENCOUNTER — Ambulatory Visit (INDEPENDENT_AMBULATORY_CARE_PROVIDER_SITE_OTHER): Payer: BC Managed Care – PPO | Admitting: Medical

## 2023-06-02 VITALS — BP 120/70 | HR 80 | Temp 98.0°F | Resp 18 | Ht 70.0 in | Wt 160.0 lb

## 2023-06-02 DIAGNOSIS — R748 Abnormal levels of other serum enzymes: Secondary | ICD-10-CM

## 2023-06-02 DIAGNOSIS — R7989 Other specified abnormal findings of blood chemistry: Secondary | ICD-10-CM | POA: Diagnosis not present

## 2023-06-02 DIAGNOSIS — Z8619 Personal history of other infectious and parasitic diseases: Secondary | ICD-10-CM | POA: Diagnosis not present

## 2023-06-02 DIAGNOSIS — R1032 Left lower quadrant pain: Secondary | ICD-10-CM | POA: Diagnosis not present

## 2023-06-02 LAB — COMPREHENSIVE METABOLIC PANEL
ALT: 81 U/L — ABNORMAL HIGH (ref 0–53)
AST: 26 U/L (ref 0–37)
Albumin: 4 g/dL (ref 3.5–5.2)
Alkaline Phosphatase: 119 U/L — ABNORMAL HIGH (ref 39–117)
BUN: 12 mg/dL (ref 6–23)
CO2: 30 meq/L (ref 19–32)
Calcium: 9.5 mg/dL (ref 8.4–10.5)
Chloride: 103 meq/L (ref 96–112)
Creatinine, Ser: 1.15 mg/dL (ref 0.40–1.50)
GFR: 78.16 mL/min (ref >60.00)
Glucose, Bld: 97 mg/dL (ref 70–99)
Potassium: 4.2 meq/L (ref 3.5–5.1)
Sodium: 140 meq/L (ref 135–145)
Total Bilirubin: 0.8 mg/dL (ref 0.2–1.2)
Total Protein: 6.1 g/dL (ref 6.0–8.3)

## 2023-06-02 LAB — CBC WITH DIFFERENTIAL/PLATELET
Basophils Absolute: 0 10*3/uL (ref 0.0–0.1)
Basophils Relative: 0.6 % (ref 0.0–3.0)
Eosinophils Absolute: 0.1 10*3/uL (ref 0.0–0.7)
Eosinophils Relative: 2.6 % (ref 0.0–5.0)
HCT: 45.6 % (ref 39.0–52.0)
Hemoglobin: 14.9 g/dL (ref 13.0–17.0)
Lymphocytes Relative: 43.3 % (ref 12.0–46.0)
Lymphs Abs: 2 10*3/uL (ref 0.7–4.0)
MCHC: 32.7 g/dL (ref 30.0–36.0)
MCV: 90.2 fL (ref 78.0–100.0)
Monocytes Absolute: 0.4 10*3/uL (ref 0.1–1.0)
Monocytes Relative: 9 % (ref 3.0–12.0)
Neutro Abs: 2 10*3/uL (ref 1.4–7.7)
Neutrophils Relative %: 44.5 % (ref 43.0–77.0)
Platelets: 307 10*3/uL (ref 150.0–400.0)
RBC: 5.06 Mil/uL (ref 4.22–5.81)
RDW: 13.2 % (ref 11.5–15.5)
WBC: 4.6 10*3/uL (ref 4.0–10.5)

## 2023-06-02 NOTE — Addendum Note (Signed)
Addended by: Gwenevere Abbot on: 06/02/2023 08:00 PM   Modules accepted: Orders

## 2023-06-02 NOTE — Patient Instructions (Signed)
1. Elevated liver enzymes -recheck liver enzymes and get liver US.  - Comp Met (CMET) -US liver today -continue no alcohol use. Hold statin presently and follow [place] and follow liver US today  2. History of Salmonella gastroenteritis -Resolved diarrhea/now formed stool and this was without antibiotic. - CBC w/Diff  3. Left lower quadrant abdominal pain -cbc  -faint llq tender on exam(not tender before found on exam). Follow cbc. If pain changes or worsens let me know.   Follow up date to be determined after lab and imaging review

## 2023-06-02 NOTE — Progress Notes (Signed)
Subjective:    Patient ID: Ethan Washington, male    DOB: 1981/02/28, 42 y.o.   MRN: [id number]  HPI  Pt in for follow up.  Pt had elevated liver enzymes after diarrhea episodes. See last visit. His culture came back showing salmonella.  Acute hep panel was negative. Pt has liver US scheduled for today at 4 pm. Pt did hold statin presently. Pt does not drink any alcohol  Pt never took cipro as by time I sent in rx diarrhea resolved. Now has formed stool.  Sensitivity studies not done on stool test.    Review of Systems  Constitutional:  Negative for chills, fatigue and fever.       Stats mild fatigue but states working a lot and traveling.  Respiratory:  Negative for cough, chest tightness, shortness of breath and wheezing.   Cardiovascular:  Negative for chest pain and palpitations.  Gastrointestinal:  Negative for abdominal pain, blood in stool and nausea.  Genitourinary:  Negative for dysuria and frequency.  Musculoskeletal:  Negative for back pain and myalgias.  Skin:  Negative for rash.  Neurological:  Negative for facial asymmetry and light-headedness.  Hematological:  Negative for adenopathy. Does not bruise/bleed easily.  Psychiatric/Behavioral:  Negative for confusion and dysphoric mood.      Past Medical History:  Diagnosis Date   Exercise induced bronchospasm    PMH of   Proteinuria      Social History   Socioeconomic History   Marital status: Married    Spouse name: Not on file   Number of children: 2   Years of education: Not on file   Highest education level: Not on file  Occupational History    Comment: [employer]  Tobacco Use   Smoking status: Never   Smokeless tobacco: Never  Substance and Sexual Activity   Alcohol use: Yes    Comment:  2 wine / week or <   Drug use: No   Sexual activity: Not on file  Other Topics Concern   Not on file  Social History Narrative   Not on file   Social Determinants of Health   Financial  Resource Strain: Not on file  Food Insecurity: Not on file  Transportation Needs: Not on file  Physical Activity: Not on file  Stress: Not on file  Social Connections: Not on file  Intimate Partner Violence: Not on file    Past Surgical History:  Procedure Laterality Date   REFRACTIVE SURGERY Bilateral    TONSILLECTOMY AND ADENOIDECTOMY     WISDOM TOOTH EXTRACTION      Family History  Problem Relation Age of Onset   Stroke Mother 57   Heart disease Mother        myocarditis   Cancer Maternal Grandfather        cns   Diabetes Maternal Grandfather    Lung cancer Paternal Grandmother        smoker   Diabetes Paternal Grandfather    Hypertension Neg Hx    Kidney disease Neg Hx     No Known Allergies  Current Outpatient Medications on File Prior to Visit  Medication Sig Dispense Refill   albuterol (PROVENTIL HFA;VENTOLIN HFA) 108 (90 BASE) MCG/ACT inhaler Inhale 2 puffs into the lungs every 6 (six) hours as needed for wheezing or shortness of breath. 1 Inhaler 0   atorvastatin (LIPITOR) 10 MG tablet Take 1 tablet (10 mg total) by mouth daily. 90 tablet 3   benzonatate (TESSALON) 100  MG capsule Take 1 capsule (100 mg total) by mouth 3 (three) times daily as needed. 30 capsule 0   cimetidine (TAGAMET) 400 MG tablet Take 1 tablet (400 mg total) by mouth 2 (two) times daily. 90 tablet 0   ciprofloxacin (CIPRO) 500 MG tablet Take 1 tablet (500 mg total) by mouth 2 (two) times daily. 14 tablet 0   fluorouracil (EFUDEX) 5 % cream Apply topically 2 (two) times daily. (Patient not taking: Reported on 08/21/2022) 40 g 2   fluticasone (FLONASE) 50 MCG/ACT nasal spray Place 2 sprays into both nostrils daily. 16 g 1   Multiple Vitamins-Minerals (MULTIVITAMIN WITH MINERALS) tablet Take 1 tablet by mouth daily.     No current facility-administered medications on file prior to visit.    BP 120/70   Pulse 80   Temp 98 F (36.7 C)   Resp 18   Ht 5\' 10"  (1.778 m)   Wt 160 lb (72.6 kg)    SpO2 100%   BMI 22.96 kg/m          Objective:   Physical Exam  General Mental Status- Alert. General Appearance- Not in acute distress.   Skin General: Color- Normal Color. Moisture- Normal Moisture.  Neck Carotid Arteries- Normal color. Moisture- Normal Moisture. No carotid bruits. No JVD.  Chest and Lung Exam Auscultation: Breath Sounds:-Normal.  Cardiovascular Auscultation:Rythm- Regular. Murmurs & Other Heart Sounds:Auscultation of the heart reveals- No Murmurs.  Abdomen Inspection:-Inspeection Normal. Palpation/Percussion:Note:No mass. Faint left lower quadrant pain o  Palpation and Percussion of the abdomen reveal- Non Tender, Non Distended + BS, no rebound or guarding.   Neurologic Cranial Nerve exam:- CN III-XII intact(No nystagmus), symmetric smile. Strength:- 5/5 equal and symmetric strength both upper and lower extremities.   Eyes- normal colored conjunctiva. Skin- normal.    Assessment & Plan:   Patient Instructions  1. Elevated liver enzymes -recheck liver enzymes and get liver US.  - Comp Met (CMET) -US liver today -continue no alcohol use. Hold statin presently and follow [place] and follow liver US today  2. History of Salmonella gastroenteritis -Resolved diarrhea/now formed stool and this was without antibiotic. - CBC w/Diff  3. Left lower quadrant abdominal pain -cbc  -faint llq tender on exam(not tender before found on exam). Follow cbc. If pain changes or worsens let me know.   Follow up date to be determined after lab and imaging review    Esperanza Richters, PA-C

## 2023-06-10 ENCOUNTER — Other Ambulatory Visit: Payer: Self-pay | Admitting: Medical

## 2023-06-12 LAB — STATE LABORATORY REPORT

## 2023-06-16 ENCOUNTER — Other Ambulatory Visit (INDEPENDENT_AMBULATORY_CARE_PROVIDER_SITE_OTHER): Payer: BC Managed Care – PPO

## 2023-06-16 DIAGNOSIS — R748 Abnormal levels of other serum enzymes: Secondary | ICD-10-CM | POA: Diagnosis not present

## 2023-06-16 LAB — COMPREHENSIVE METABOLIC PANEL
ALT: 31 U/L (ref 0–53)
AST: 20 U/L (ref 0–37)
Albumin: 4.1 g/dL (ref 3.5–5.2)
Alkaline Phosphatase: 93 U/L (ref 39–117)
BUN: 14 mg/dL (ref 6–23)
CO2: 31 meq/L (ref 19–32)
Calcium: 9.1 mg/dL (ref 8.4–10.5)
Chloride: 102 meq/L (ref 96–112)
Creatinine, Ser: 1.15 mg/dL (ref 0.40–1.50)
GFR: 78.14 mL/min (ref >60.00)
Glucose, Bld: 130 mg/dL — ABNORMAL HIGH (ref 70–99)
Potassium: 3.9 meq/L (ref 3.5–5.1)
Sodium: 139 meq/L (ref 135–145)
Total Bilirubin: 0.7 mg/dL (ref 0.2–1.2)
Total Protein: 6.3 g/dL (ref 6.0–8.3)

## 2023-08-21 ENCOUNTER — Ambulatory Visit (INDEPENDENT_AMBULATORY_CARE_PROVIDER_SITE_OTHER): Payer: BC Managed Care – PPO | Admitting: Medical

## 2023-08-21 ENCOUNTER — Encounter: Payer: Self-pay | Admitting: Medical

## 2023-08-21 VITALS — BP 140/78 | HR 74 | Temp 98.0°F | Resp 18 | Ht 70.0 in | Wt 175.8 lb

## 2023-08-21 DIAGNOSIS — E785 Hyperlipidemia, unspecified: Secondary | ICD-10-CM

## 2023-08-21 DIAGNOSIS — R739 Hyperglycemia, unspecified: Secondary | ICD-10-CM | POA: Diagnosis not present

## 2023-08-21 DIAGNOSIS — R030 Elevated blood-pressure reading, without diagnosis of hypertension: Secondary | ICD-10-CM | POA: Diagnosis not present

## 2023-08-21 DIAGNOSIS — R748 Abnormal levels of other serum enzymes: Secondary | ICD-10-CM | POA: Diagnosis not present

## 2023-08-21 LAB — COMPREHENSIVE METABOLIC PANEL
ALT: 71 U/L — ABNORMAL HIGH (ref 0–53)
AST: 39 U/L — ABNORMAL HIGH (ref 0–37)
Albumin: 4.2 g/dL (ref 3.5–5.2)
Alkaline Phosphatase: 88 U/L (ref 39–117)
BUN: 17 mg/dL (ref 6–23)
CO2: 33 meq/L — ABNORMAL HIGH (ref 19–32)
Calcium: 9.2 mg/dL (ref 8.4–10.5)
Chloride: 102 meq/L (ref 96–112)
Creatinine, Ser: 1.24 mg/dL (ref 0.40–1.50)
GFR: 71.29 mL/min (ref >60.00)
Glucose, Bld: 104 mg/dL — ABNORMAL HIGH (ref 70–99)
Potassium: 4.4 meq/L (ref 3.5–5.1)
Sodium: 140 meq/L (ref 135–145)
Total Bilirubin: 0.4 mg/dL (ref 0.2–1.2)
Total Protein: 6.2 g/dL (ref 6.0–8.3)

## 2023-08-21 LAB — LIPID PANEL
Cholesterol: 189 mg/dL (ref 0–200)
HDL: 58.7 mg/dL (ref >39.00)
LDL Cholesterol: 108 mg/dL — ABNORMAL HIGH (ref 0–99)
NonHDL: 130.68
Total CHOL/HDL Ratio: 3
Triglycerides: 114 mg/dL (ref 0.0–149.0)
VLDL: 22.8 mg/dL (ref 0.0–40.0)

## 2023-08-21 LAB — HEMOGLOBIN A1C: Hgb A1c MFr Bld: 5.5 % (ref 4.6–6.5)

## 2023-08-21 NOTE — Progress Notes (Signed)
Subjective:    Patient ID: Ethan Washington, male    DOB: 01/28/1981, 42 y.o.   MRN: [id number]  HPI  Discussed the use of AI scribe software for clinical note transcription with the patient, who gave verbal consent to proceed.  History of Present Illness   The patient, with a recent history of Salmonella infection and elevated liver enzymes, reports that the gastrointestinal symptoms have resolved. They did not require Ciprofloxacin treatment as the infection was self-limiting. The patient had been ill for approximately one and a half to two weeks prior to seeking medical attention. The liver enzymes, initially elevated, normalized without intervention. An ultrasound of the liver was unremarkable.  The patient also reports a recent weight gain despite increased physical activity, walking approximately four and a half miles daily. They had lost weight during the period of gastrointestinal illness but have since regained it.  The patient is on Atorvastatin, which was briefly discontinued during the period of elevated liver enzymes but has since been restarted without any reported side effects.  The patient also reports an increase in blood pressure readings, which were previously in the 125-130 range at home. They have not checked their blood pressure in the last couple of weeks but plan to monitor it daily for the next week. They acknowledge recent dietary changes, including increased salt and sugar intake, which may be contributing to the elevated blood pressure.  The patient's blood glucose was also noted to be elevated on a recent test, which they attribute to consuming either orange juice or a soda prior to the test. They are agreeable to further testing to assess their average blood glucose levels.        Review of Systems  Constitutional:  Negative for chills, fatigue and fever.  Respiratory:  Negative for cough, chest tightness, shortness of breath and wheezing.   Cardiovascular:   Negative for chest pain and palpitations.  Gastrointestinal:  Negative for abdominal pain, blood in stool and nausea.  Genitourinary:  Negative for dysuria and frequency.  Musculoskeletal:  Negative for back pain and myalgias.  Skin:  Negative for rash.  Neurological:  Negative for facial asymmetry and light-headedness.  Hematological:  Negative for adenopathy. Does not bruise/bleed easily.  Psychiatric/Behavioral:  Negative for confusion and dysphoric mood.    Past Medical History:  Diagnosis Date   Exercise induced bronchospasm    PMH of   Proteinuria      Social History   Socioeconomic History   Marital status: Married    Spouse name: Not on file   Number of children: 2   Years of education: Not on file   Highest education level: Not on file  Occupational History    Comment: [employer]  Tobacco Use   Smoking status: Never   Smokeless tobacco: Never  Substance and Sexual Activity   Alcohol use: Yes    Comment:  2 wine / week or <   Drug use: No   Sexual activity: Not on file  Other Topics Concern   Not on file  Social History Narrative   Not on file   Social Drivers of Health   Financial Resource Strain: Not on file  Food Insecurity: Not on file  Transportation Needs: Not on file  Physical Activity: Not on file  Stress: Not on file  Social Connections: Not on file  Intimate Partner Violence: Not on file    Past Surgical History:  Procedure Laterality Date   REFRACTIVE SURGERY Bilateral  TONSILLECTOMY AND ADENOIDECTOMY     WISDOM TOOTH EXTRACTION      Family History  Problem Relation Age of Onset   Stroke Mother 15   Heart disease Mother        myocarditis   Cancer Maternal Grandfather        cns   Diabetes Maternal Grandfather    Lung cancer Paternal Grandmother        smoker   Diabetes Paternal Grandfather    Hypertension Neg Hx    Kidney disease Neg Hx     No Known Allergies  Current Outpatient Medications on File Prior to  Visit  Medication Sig Dispense Refill   albuterol (PROVENTIL HFA;VENTOLIN HFA) 108 (90 BASE) MCG/ACT inhaler Inhale 2 puffs into the lungs every 6 (six) hours as needed for wheezing or shortness of breath. 1 Inhaler 0   atorvastatin (LIPITOR) 10 MG tablet Take 1 tablet (10 mg total) by mouth daily. 90 tablet 3   benzonatate (TESSALON) 100 MG capsule Take 1 capsule (100 mg total) by mouth 3 (three) times daily as needed. 30 capsule 0   cimetidine (TAGAMET) 400 MG tablet Take 1 tablet (400 mg total) by mouth 2 (two) times daily. 90 tablet 0   ciprofloxacin (CIPRO) 500 MG tablet Take 1 tablet (500 mg total) by mouth 2 (two) times daily. 14 tablet 0   fluorouracil (EFUDEX) 5 % cream Apply topically 2 (two) times daily. (Patient not taking: Reported on 08/21/2022) 40 g 2   fluticasone (FLONASE) 50 MCG/ACT nasal spray Place 2 sprays into both nostrils daily. 48 mL 1   Multiple Vitamins-Minerals (MULTIVITAMIN WITH MINERALS) tablet Take 1 tablet by mouth daily.     No current facility-administered medications on file prior to visit.    BP (!) 140/78   Pulse 74   Temp 98 F (36.7 C)   Resp 18   Ht 5\' 10"  (1.778 m)   Wt 175 lb 12.8 oz (79.7 kg)   SpO2 99%   BMI 25.22 kg/m         Objective:   Physical Exam  General Mental Status- Alert. General Appearance- Not in acute distress.   Skin General: Color- Normal Color. Moisture- Normal Moisture.  Neck Carotid Arteries- Normal color. Moisture- Normal Moisture. No carotid bruits. No JVD.  Chest and Lung Exam Auscultation: Breath Sounds:-Normal.  Cardiovascular Auscultation:Rythm- Regular. Murmurs & Other Heart Sounds:Auscultation of the heart reveals- No Murmurs.  Abdomen Inspection:-Inspeection Normal. Palpation/Percussion:Note:No mass. Palpation and Percussion of the abdomen reveal- Non Tender, Non Distended + BS, no rebound or guarding.   Neurologic Cranial Nerve exam:- CN III-XII intact(No nystagmus), symmetric  smile. Strength:- 5/5 equal and symmetric strength both upper and lower extremities.       Assessment & Plan:   Assessment and Plan    Elevated Blood Pressure Initial reading of 156/92, but recheck was 140/78. Patient reports home readings have been in the 125-130 systolic range. Recent weight gain noted. -Advise daily blood pressure checks at home for the next week. -Advise patient to limit salt intake. -Request patient to send blood pressure readings via MyChart in one week.  Hyperlipidemia Patient is on Atorvastatin with no reported side effects. Lipid panel last checked in June. -Continue Atorvastatin. -Order lipid panel.  Past Medical History of Salmonella Infection Resolved with self-limiting course. Elevated liver enzymes during illness have since normalized. -Order metabolic panel to recheck liver enzymes.  Elevated Fasting Glucose Patient reported possible dietary cause (consumption of orange juice or Coke prior  to test). -Order A1C to assess average glucose levels.  Weight Management Patient reports weight gain despite increased physical activity. Expressed desire to lose weight. -Encourage continued physical activity and healthy diet, particularly after the holiday season.   Follow up date to be determined after lab review.       Esperanza Richters, PA-C

## 2023-08-21 NOTE — Patient Instructions (Signed)
Elevated Blood Pressure Initial reading of 156/92, but recheck was 140/78. Patient reports home readings have been in the 125-130 systolic range. Recent weight gain noted. -Advise daily blood pressure checks at home for the next week. -Advise patient to limit salt intake. -Request patient to send blood pressure readings via MyChart in one week.  Hyperlipidemia Patient is on Atorvastatin with no reported side effects. Lipid panel last checked in June. -Continue Atorvastatin. -Order lipid panel.  Past Medical History of Salmonella Infection Resolved with self-limiting course. Elevated liver enzymes during illness have since normalized. -Order metabolic panel to recheck liver enzymes.  Elevated Fasting Glucose Patient reported possible dietary cause (consumption of orange juice or Coke prior to test). -Order A1C to assess average glucose levels.  Weight Management Patient reports weight gain despite increased physical activity. Expressed desire to lose weight. -Encourage continued physical activity and healthy diet, particularly after the holiday season.   Follow up date to be determined after lab review.

## 2023-08-22 ENCOUNTER — Encounter: Payer: Self-pay | Admitting: Medical

## 2023-10-13 ENCOUNTER — Other Ambulatory Visit: Payer: BC Managed Care – PPO

## 2023-10-13 ENCOUNTER — Other Ambulatory Visit (INDEPENDENT_AMBULATORY_CARE_PROVIDER_SITE_OTHER): Payer: BC Managed Care – PPO

## 2023-10-13 DIAGNOSIS — R748 Abnormal levels of other serum enzymes: Secondary | ICD-10-CM

## 2023-10-13 LAB — COMPREHENSIVE METABOLIC PANEL
ALT: 40 U/L (ref 0–53)
AST: 23 U/L (ref 0–37)
Albumin: 4.6 g/dL (ref 3.5–5.2)
Alkaline Phosphatase: 85 U/L (ref 39–117)
BUN: 20 mg/dL (ref 6–23)
CO2: 31 meq/L (ref 19–32)
Calcium: 9.4 mg/dL (ref 8.4–10.5)
Chloride: 101 meq/L (ref 96–112)
Creatinine, Ser: 1.34 mg/dL (ref 0.40–1.50)
GFR: 64.89 mL/min (ref >60.00)
Glucose, Bld: 107 mg/dL — ABNORMAL HIGH (ref 70–99)
Potassium: 4.1 meq/L (ref 3.5–5.1)
Sodium: 140 meq/L (ref 135–145)
Total Bilirubin: 0.9 mg/dL (ref 0.2–1.2)
Total Protein: 6.9 g/dL (ref 6.0–8.3)

## 2023-10-14 ENCOUNTER — Encounter: Payer: Self-pay | Admitting: Medical

## 2024-06-19 ENCOUNTER — Other Ambulatory Visit: Payer: Self-pay | Admitting: Medical

## 2024-07-14 ENCOUNTER — Other Ambulatory Visit: Payer: Self-pay | Admitting: Medical

## 2024-07-14 NOTE — Telephone Encounter (Signed)
LMOM asking for call back to schedule appt.  

## 2024-07-16 ENCOUNTER — Other Ambulatory Visit: Payer: Self-pay | Admitting: Medical

## 2024-07-18 NOTE — Telephone Encounter (Signed)
 See refill request from 07/14/24. LMOM informing Pt he is overdue for appt. Rx denied.

## 2024-09-09 ENCOUNTER — Ambulatory Visit: Admitting: Medical

## 2024-09-09 ENCOUNTER — Telehealth: Payer: Self-pay | Admitting: Medical

## 2024-09-09 VITALS — BP 122/86 | HR 91 | Temp 98.0°F | Resp 15 | Ht 70.0 in | Wt 179.0 lb

## 2024-09-09 DIAGNOSIS — Z0000 Encounter for general adult medical examination without abnormal findings: Secondary | ICD-10-CM

## 2024-09-09 DIAGNOSIS — R739 Hyperglycemia, unspecified: Secondary | ICD-10-CM

## 2024-09-09 DIAGNOSIS — Z23 Encounter for immunization: Secondary | ICD-10-CM

## 2024-09-09 DIAGNOSIS — J4520 Mild intermittent asthma, uncomplicated: Secondary | ICD-10-CM

## 2024-09-09 DIAGNOSIS — Z1159 Encounter for screening for other viral diseases: Secondary | ICD-10-CM

## 2024-09-09 DIAGNOSIS — Z0184 Encounter for antibody response examination: Secondary | ICD-10-CM

## 2024-09-09 MED ORDER — ALBUTEROL SULFATE HFA 108 (90 BASE) MCG/ACT IN AERS: 2.0000 | INHALATION_SPRAY | Freq: Four times a day (QID) | RESPIRATORY_TRACT | 0 refills | Status: AC | PRN

## 2024-09-09 NOTE — Addendum Note (Signed)
 Addended by: GERARD CHUCKIE SAILOR on: 09/09/2024 02:39 PM   Modules accepted: Orders

## 2024-09-09 NOTE — Telephone Encounter (Signed)
 Called pt back and notified him that it would be best if he did fast for labs. But if he really needs to eat something then we can just schedulke him a lab visit for another day while he is fasting. He agreed he would rather take care of it all today

## 2024-09-09 NOTE — Progress Notes (Signed)
 "  Subjective:    Patient ID: Ethan Washington, male    DOB: 02-Jan-1981, 44 y.o.   MRN: [id number]  HPI   wellness exam. Fasting today.   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 Has been exercising 6 days a week at gym. Eating healthy except when travels. Non smoker, no coffee and rare alcohol.   Pt had flu vaccine.  Will get pcv 20 vaccine today.  Declines hiv screen. Not indicated per pt.  Will get hep c screen and immunity status test for hep b.  Rare asthma. Rx albuterol  and give pcv 20.      Review of Systems  Constitutional:  Negative for chills, fatigue and fever.  Respiratory:  Negative for chest tightness, shortness of breath and wheezing.   Cardiovascular:  Negative for chest pain and palpitations.  Gastrointestinal:  Negative for abdominal pain.  Genitourinary:  Negative for dysuria and frequency.  Musculoskeletal:  Negative for back pain, myalgias and neck pain.  Neurological:  Negative for dizziness, weakness and light-headedness.  Hematological:  Negative for adenopathy. Does not bruise/bleed easily.  Psychiatric/Behavioral:  Negative for confusion.     Past Medical History:  Diagnosis Date   Exercise induced bronchospasm    PMH of   Proteinuria      Social History   Socioeconomic History   Marital status: Married    Spouse name: Not on file   Number of children: 2   Years of education: Not on file   Highest education level: Not on file  Occupational History    Comment: [employer]  Tobacco Use   Smoking status: Never   Smokeless tobacco: Never  Substance and Sexual Activity   Alcohol use: Yes    Comment:  2 wine / week or <   Drug use: No   Sexual activity: Not on file  Other Topics Concern   Not on file  Social History Narrative   Not on file   Social Drivers of Health   Tobacco Use: Low Risk (08/21/2023)   Patient History    Smoking Tobacco Use: Never    Smokeless Tobacco Use: Never    Passive  Exposure: Not on file  Financial Resource Strain: Not on file  Food Insecurity: Not on file  Transportation Needs: Not on file  Physical Activity: Not on file  Stress: Not on file  Social Connections: Not on file  Intimate Partner Violence: Not on file  Depression (PHQ2-9): Low Risk (09/09/2024)   Depression (PHQ2-9)    PHQ-2 Score: 1  Alcohol Screen: Not on file  Housing: Not on file  Utilities: Not on file  Health Literacy: Not on file    Past Surgical History:  Procedure Laterality Date   REFRACTIVE SURGERY Bilateral    TONSILLECTOMY AND ADENOIDECTOMY     WISDOM TOOTH EXTRACTION      Family History  Problem Relation Age of Onset   Stroke Mother 71   Heart disease Mother        myocarditis   Cancer Maternal Grandfather        cns   Diabetes Maternal Grandfather    Lung cancer Paternal Grandmother        smoker   Diabetes Paternal Grandfather    Hypertension Neg Hx    Kidney disease Neg Hx     Allergies[1]  Medications Ordered Prior to Encounter[2]  BP 122/86   Pulse 91   Temp 98 F (36.7 C) (Oral)   Resp  15   Ht 5' 10 (1.778 m)   Wt 179 lb (81.2 kg)   SpO2 99%   BMI 25.68 kg/m        Objective:   Physical Exam  General Mental Status- Alert. General Appearance- Not in acute distress.   Skin No worrisome moles or lesions  Neck  No JVD.  Chest and Lung Exam Auscultation: Breath Sounds:-CTA  Cardiovascular Auscultation:Rythm-RRR Murmurs & Other Heart Sounds:Auscultation of the heart reveals- No Murmurs.  Abdomen Inspection:-Inspeection Normal. Palpation/Percussion:Note:No mass. Palpation and Percussion of the abdomen reveal- Non Tender, Non Distended + BS, no rebound or guarding.   Neurologic Cranial Nerve exam:- CN III-XII intact(No nystagmus), symmetric smile. Strength:- 5/5 equal and symmetric strength both upper and lower extremities.       Assessment & Plan:   Patient Instructions  For you wellness exam today I have ordered  cbc, cmp, hep b surface antibody, hep c antibody and  lipid panel. A1c included due to mild elevated sugar on prior labs  Vaccine given today pcv 20  Recommend exercise and healthy diet.  We will let you know lab results as they come in.  Follow up date appointment will be determined after lab review.       Harjit Leider, PA-C     [1] No Known Allergies [2]  Current Outpatient Medications on File Prior to Visit  Medication Sig Dispense Refill   albuterol  (PROVENTIL  HFA;VENTOLIN  HFA) 108 (90 BASE) MCG/ACT inhaler Inhale 2 puffs into the lungs every 6 (six) hours as needed for wheezing or shortness of breath. 1 Inhaler 0   atorvastatin  (LIPITOR) 10 MG tablet TAKE 1 TABLET BY MOUTH EVERY DAY 30 tablet 0   benzonatate  (TESSALON ) 100 MG capsule Take 1 capsule (100 mg total) by mouth 3 (three) times daily as needed. (Patient not taking: Reported on 09/09/2024) 30 capsule 0   cimetidine (TAGAMET) 400 MG tablet Take 1 tablet (400 mg total) by mouth 2 (two) times daily. (Patient not taking: Reported on 09/09/2024) 90 tablet 0   ciprofloxacin  (CIPRO ) 500 MG tablet Take 1 tablet (500 mg total) by mouth 2 (two) times daily. (Patient not taking: Reported on 09/09/2024) 14 tablet 0   fluorouracil  (EFUDEX ) 5 % cream Apply topically 2 (two) times daily. (Patient not taking: Reported on 09/09/2024) 40 g 2   fluticasone  (FLONASE ) 50 MCG/ACT nasal spray Place 2 sprays into both nostrils daily. (Patient not taking: Reported on 09/09/2024) 48 mL 1   Multiple Vitamins-Minerals (MULTIVITAMIN WITH MINERALS) tablet Take 1 tablet by mouth daily. (Patient not taking: Reported on 09/09/2024)     No current facility-administered medications on file prior to visit.   "

## 2024-09-09 NOTE — Telephone Encounter (Signed)
 Copied from CRM [id number]. Topic: General - Call Back - No Documentation >> Sep 09, 2024 10:58 AM Ethan Washington wrote: Reason for CRM: patient would like to know if he needs to fast for his appt today, he does not have any orders in his chart and stated he has not eaten since 6:30 last night. Please advise [phone]

## 2024-09-09 NOTE — Addendum Note (Signed)
 Addended by: Kassity Woodson C on: 09/09/2024 02:48 PM   Modules accepted: Orders

## 2024-09-09 NOTE — Patient Instructions (Addendum)
 For you wellness exam today I have ordered cbc, cmp, hep b surface antibody, hep c antibody and  lipid panel. A1c included due to mild elevated sugar on prior labs  Vaccine given today pcv 20  Recommend exercise and healthy diet.  We will let you know lab results as they come in.  Follow up date appointment will be determined after lab review.     Preventive Care 57-45 Years Old, Male Preventive care refers to lifestyle choices and visits with your health care provider that can promote health and wellness. Preventive care visits are also called wellness exams. What can I expect for my preventive care visit? Counseling During your preventive care visit, your health care provider may ask about your: Medical history, including: Past medical problems. Family medical history. Current health, including: Emotional well-being. Home life and relationship well-being. Sexual activity. Lifestyle, including: Alcohol, nicotine or tobacco, and drug use. Access to firearms. Diet, exercise, and sleep habits. Safety issues such as seatbelt and bike helmet use. Sunscreen use. Work and work astronomer. Physical exam Your health care provider will check your: Height and weight. These may be used to calculate your BMI (body mass index). BMI is a measurement that tells if you are at a healthy weight. Waist circumference. This measures the distance around your waistline. This measurement also tells if you are at a healthy weight and may help predict your risk of certain diseases, such as type 2 diabetes and high blood pressure. Heart rate and blood pressure. Body temperature. Skin for abnormal spots. What immunizations do I need?  Vaccines are usually given at various ages, according to a schedule. Your health care provider will recommend vaccines for you based on your age, medical history, and lifestyle or other factors, such as travel or where you work. What tests do I need? Screening Your  health care provider may recommend screening tests for certain conditions. This may include: Lipid and cholesterol levels. Diabetes screening. This is done by checking your blood sugar (glucose) after you have not eaten for a while (fasting). Hepatitis B test. Hepatitis C test. HIV (human immunodeficiency virus) test. STI (sexually transmitted infection) testing, if you are at risk. Lung cancer screening. Prostate cancer screening. Colorectal cancer screening. Talk with your health care provider about your test results, treatment options, and if necessary, the need for more tests. Follow these instructions at home: Eating and drinking  Eat a diet that includes fresh fruits and vegetables, whole grains, lean protein, and low-fat dairy products. Take vitamin and mineral supplements as recommended by your health care provider. Do not drink alcohol if your health care provider tells you not to drink. If you drink alcohol: Limit how much you have to 0-2 drinks a day. Know how much alcohol is in your drink. In the [place] one drink equals one 12 oz bottle of beer (355 mL), one 5 oz glass of wine (148 mL), or one 1 oz glass of hard liquor (44 mL). Lifestyle Brush your teeth every morning and night with fluoride toothpaste. Floss one time each day. Exercise for at least 30 minutes 5 or more days each week. Do not use any products that contain nicotine or tobacco. These products include cigarettes, chewing tobacco, and vaping devices, such as e-cigarettes. If you need help quitting, ask your health care provider. Do not use drugs. If you are sexually active, practice safe sex. Use a condom or other form of protection to prevent STIs. Take aspirin only as told by your health  care provider. Make sure that you understand how much to take and what form to take. Work with your health care provider to find out whether it is safe and beneficial for you to take aspirin daily. Find healthy ways to manage  stress, such as: Meditation, yoga, or listening to music. Journaling. Talking to a trusted person. Spending time with friends and family. Minimize exposure to UV radiation to reduce your risk of skin cancer. Safety Always wear your seat belt while driving or riding in a vehicle. Do not drive: If you have been drinking alcohol. Do not ride with someone who has been drinking. When you are tired or distracted. While texting. If you have been using any mind-altering substances or drugs. Wear a helmet and other protective equipment during sports activities. If you have firearms in your house, make sure you follow all gun safety procedures. What's next? Go to your health care provider once a year for an annual wellness visit. Ask your health care provider how often you should have your eyes and teeth checked. Stay up to date on all vaccines. This information is not intended to replace advice given to you by your health care provider. Make sure you discuss any questions you have with your health care provider. Document Revised: 02/06/2021 Document Reviewed: 02/06/2021 Elsevier Patient Education  2024 [employer].

## 2024-09-11 ENCOUNTER — Ambulatory Visit: Payer: Self-pay | Admitting: Medical

## 2024-09-11 ENCOUNTER — Other Ambulatory Visit: Payer: Self-pay | Admitting: Medical

## 2024-09-11 LAB — COMPREHENSIVE METABOLIC PANEL WITH GFR
AG Ratio: 2.3 (calc) (ref 1.0–2.5)
ALT: 35 U/L (ref 9–46)
AST: 25 U/L (ref 10–40)
Albumin: 4.4 g/dL (ref 3.6–5.1)
Alkaline phosphatase (APISO): 86 U/L (ref 36–130)
BUN: 15 mg/dL (ref 7–25)
CO2: 30 mmol/L (ref 20–32)
Calcium: 9.2 mg/dL (ref 8.6–10.3)
Chloride: 99 mmol/L (ref 98–110)
Creat: 1.28 mg/dL (ref 0.60–1.29)
Globulin: 1.9 g/dL (ref 1.9–3.7)
Glucose, Bld: 91 mg/dL (ref 65–99)
Potassium: 4 mmol/L (ref 3.5–5.3)
Sodium: 136 mmol/L (ref 135–146)
Total Bilirubin: 0.7 mg/dL (ref 0.2–1.2)
Total Protein: 6.3 g/dL (ref 6.1–8.1)
eGFR: 71 mL/min/1.73m2

## 2024-09-11 LAB — CBC WITH DIFFERENTIAL/PLATELET
Absolute Lymphocytes: 1706 {cells}/uL (ref 850–3900)
Absolute Monocytes: 437 {cells}/uL (ref 200–950)
Basophils Absolute: 21 {cells}/uL (ref 0–200)
Basophils Relative: 0.4 %
Eosinophils Absolute: 68 {cells}/uL (ref 15–500)
Eosinophils Relative: 1.3 %
HCT: 47.6 % (ref 39.4–51.1)
Hemoglobin: 16 g/dL (ref 13.2–17.1)
MCH: 30.3 pg (ref 27.0–33.0)
MCHC: 33.6 g/dL (ref 31.6–35.4)
MCV: 90.2 fL (ref 81.4–101.7)
MPV: 9.8 fL (ref 7.5–12.5)
Monocytes Relative: 8.4 %
Neutro Abs: 2969 {cells}/uL (ref 1500–7800)
Neutrophils Relative %: 57.1 %
Platelets: 235 Thousand/uL (ref 140–400)
RBC: 5.28 Million/uL (ref 4.20–5.80)
RDW: 12.1 % (ref 11.0–15.0)
Total Lymphocyte: 32.8 %
WBC: 5.2 Thousand/uL (ref 3.8–10.8)

## 2024-09-11 LAB — LIPID PANEL
Cholesterol: 192 mg/dL
HDL: 63 mg/dL
LDL Cholesterol (Calc): 111 mg/dL — ABNORMAL HIGH
Non-HDL Cholesterol (Calc): 129 mg/dL
Total CHOL/HDL Ratio: 3 (calc)
Triglycerides: 85 mg/dL

## 2024-09-11 LAB — HEPATITIS C ANTIBODY: Hepatitis C Ab: NONREACTIVE

## 2024-09-11 LAB — HEMOGLOBIN A1C
Hgb A1c MFr Bld: 5.2 %
Mean Plasma Glucose: 103 mg/dL
eAG (mmol/L): 5.7 mmol/L

## 2024-09-11 LAB — HEPATITIS B SURFACE ANTIBODY, QUANTITATIVE: Hep B S AB Quant (Post): >1000 m[IU]/mL

## 2024-09-11 MED ORDER — ATORVASTATIN CALCIUM 10 MG PO TABS: 10.0000 mg | ORAL_TABLET | Freq: Every day | ORAL | 3 refills | Status: DC

## 2024-09-11 NOTE — Addendum Note (Signed)
 Addended by: DORINA DALLAS DORINA PA-C M on: 09/11/2024 01:55 PM   Modules accepted: Orders

## 2024-09-12 ENCOUNTER — Other Ambulatory Visit: Payer: Self-pay

## 2024-09-12 MED ORDER — ATORVASTATIN CALCIUM 10 MG PO TABS: 10.0000 mg | ORAL_TABLET | Freq: Every day | ORAL | 3 refills | Status: AC
